# Patient Record
Sex: Male | Born: 1970 | Race: Black or African American | Hispanic: No | Marital: Married | Smoking: Former smoker
Health system: Southern US, Community
[De-identification: ages and names within clinical notes are randomized; demographics above are authoritative.]

## PROBLEM LIST (undated history)

## (undated) DIAGNOSIS — K219 Gastro-esophageal reflux disease without esophagitis: Secondary | ICD-10-CM

## (undated) DIAGNOSIS — Z720 Tobacco use: Secondary | ICD-10-CM

## (undated) DIAGNOSIS — I10 Essential (primary) hypertension: Secondary | ICD-10-CM

## (undated) DIAGNOSIS — J40 Bronchitis, not specified as acute or chronic: Secondary | ICD-10-CM

## (undated) DIAGNOSIS — E7800 Pure hypercholesterolemia, unspecified: Secondary | ICD-10-CM

## (undated) DIAGNOSIS — E669 Obesity, unspecified: Secondary | ICD-10-CM

## (undated) DIAGNOSIS — J948 Other specified pleural conditions: Principal | ICD-10-CM

## (undated) DIAGNOSIS — J189 Pneumonia, unspecified organism: Secondary | ICD-10-CM

## (undated) DIAGNOSIS — R7303 Prediabetes: Secondary | ICD-10-CM

## (undated) HISTORY — DX: Other specified pleural conditions: J94.8

## (undated) HISTORY — PX: APPENDECTOMY: SHX54

---

## 2003-03-26 DIAGNOSIS — J948 Other specified pleural conditions: Secondary | ICD-10-CM

## 2003-03-26 HISTORY — DX: Other specified pleural conditions: J94.8

## 2010-11-27 ENCOUNTER — Emergency Department (HOSPITAL_COMMUNITY)
Admission: EM | Admit: 2010-11-27 | Discharge: 2010-11-27 | Disposition: A | Discharge status: Home / Self Care | Payer: Self-pay | Attending: Emergency Medicine | Admitting: Emergency Medicine

## 2010-11-27 DIAGNOSIS — J45909 Unspecified asthma, uncomplicated: Secondary | ICD-10-CM | POA: Insufficient documentation

## 2010-11-27 DIAGNOSIS — F17200 Nicotine dependence, unspecified, uncomplicated: Secondary | ICD-10-CM | POA: Insufficient documentation

## 2010-11-27 DIAGNOSIS — I10 Essential (primary) hypertension: Secondary | ICD-10-CM | POA: Insufficient documentation

## 2010-11-27 DIAGNOSIS — R509 Fever, unspecified: Secondary | ICD-10-CM | POA: Insufficient documentation

## 2010-11-27 DIAGNOSIS — J189 Pneumonia, unspecified organism: Secondary | ICD-10-CM | POA: Insufficient documentation

## 2011-01-24 DIAGNOSIS — J189 Pneumonia, unspecified organism: Secondary | ICD-10-CM

## 2011-01-24 HISTORY — DX: Pneumonia, unspecified organism: J18.9

## 2011-02-07 ENCOUNTER — Emergency Department (HOSPITAL_COMMUNITY): Payer: Self-pay

## 2011-02-07 ENCOUNTER — Emergency Department (HOSPITAL_COMMUNITY)
Admission: EM | Admit: 2011-02-07 | Discharge: 2011-02-08 | Disposition: A | Discharge status: Home / Self Care | Payer: Self-pay | Attending: Emergency Medicine | Admitting: Emergency Medicine

## 2011-02-07 ENCOUNTER — Encounter: Payer: Self-pay | Admitting: Emergency Medicine

## 2011-02-07 DIAGNOSIS — R05 Cough: Secondary | ICD-10-CM | POA: Insufficient documentation

## 2011-02-07 DIAGNOSIS — J189 Pneumonia, unspecified organism: Secondary | ICD-10-CM | POA: Insufficient documentation

## 2011-02-07 DIAGNOSIS — R062 Wheezing: Secondary | ICD-10-CM | POA: Insufficient documentation

## 2011-02-07 DIAGNOSIS — E669 Obesity, unspecified: Secondary | ICD-10-CM | POA: Insufficient documentation

## 2011-02-07 DIAGNOSIS — R0602 Shortness of breath: Secondary | ICD-10-CM | POA: Insufficient documentation

## 2011-02-07 DIAGNOSIS — R5381 Other malaise: Secondary | ICD-10-CM | POA: Insufficient documentation

## 2011-02-07 DIAGNOSIS — E7800 Pure hypercholesterolemia, unspecified: Secondary | ICD-10-CM | POA: Insufficient documentation

## 2011-02-07 DIAGNOSIS — R509 Fever, unspecified: Secondary | ICD-10-CM | POA: Insufficient documentation

## 2011-02-07 DIAGNOSIS — IMO0001 Reserved for inherently not codable concepts without codable children: Secondary | ICD-10-CM | POA: Insufficient documentation

## 2011-02-07 DIAGNOSIS — I10 Essential (primary) hypertension: Secondary | ICD-10-CM | POA: Insufficient documentation

## 2011-02-07 DIAGNOSIS — R059 Cough, unspecified: Secondary | ICD-10-CM | POA: Insufficient documentation

## 2011-02-07 HISTORY — DX: Bronchitis, not specified as acute or chronic: J40

## 2011-02-07 HISTORY — DX: Obesity, unspecified: E66.9

## 2011-02-07 HISTORY — DX: Pure hypercholesterolemia, unspecified: E78.00

## 2011-02-07 HISTORY — DX: Pneumonia, unspecified organism: J18.9

## 2011-02-07 HISTORY — DX: Essential (primary) hypertension: I10

## 2011-02-07 LAB — CBC
MCH: 29.7 pg (ref 26.0–34.0)
MCV: 88.5 fL (ref 78.0–100.0)
Platelets: 164 10*3/uL (ref 150–400)
RDW: 14 % (ref 11.5–15.5)

## 2011-02-07 LAB — BASIC METABOLIC PANEL
Calcium: 8.7 mg/dL (ref 8.4–10.5)
GFR calc Af Amer: >90 mL/min (ref >90)
GFR calc non Af Amer: >90 mL/min (ref >90)
Sodium: 136 mEq/L (ref 135–145)

## 2011-02-07 LAB — DIFFERENTIAL
Basophils Absolute: 0 10*3/uL (ref 0.0–0.1)
Basophils Relative: 1 % (ref 0–1)
Eosinophils Absolute: 0.3 10*3/uL (ref 0.0–0.7)
Eosinophils Relative: 6 % — ABNORMAL HIGH (ref 0–5)
Neutrophils Relative %: 60 % (ref 43–77)

## 2011-02-07 MED ORDER — DIPHENHYDRAMINE HCL 25 MG PO CAPS
25.0000 mg | ORAL_CAPSULE | Freq: Once | ORAL | Status: AC
  Administered 2011-02-07: 25 mg via ORAL
  Filled 2011-02-07: qty 1

## 2011-02-07 MED ORDER — IPRATROPIUM BROMIDE 0.02 % IN SOLN
0.5000 mg | Freq: Once | RESPIRATORY_TRACT | Status: AC
  Administered 2011-02-07: 0.5 mg via RESPIRATORY_TRACT
  Filled 2011-02-07: qty 2.5

## 2011-02-07 MED ORDER — ALBUTEROL SULFATE (5 MG/ML) 0.5% IN NEBU
INHALATION_SOLUTION | RESPIRATORY_TRACT | Status: AC
  Filled 2011-02-07: qty 1.5

## 2011-02-07 MED ORDER — ALBUTEROL (5 MG/ML) CONTINUOUS INHALATION SOLN
5.0000 mg/h | INHALATION_SOLUTION | Freq: Once | RESPIRATORY_TRACT | Status: AC
  Administered 2011-02-07: 5 mg/h via RESPIRATORY_TRACT
  Filled 2011-02-07: qty 20

## 2011-02-07 MED ORDER — ACETAMINOPHEN 500 MG PO TABS
1000.0000 mg | ORAL_TABLET | ORAL | Status: AC
  Administered 2011-02-08: 1000 mg via ORAL
  Filled 2011-02-07: qty 2

## 2011-02-07 MED ORDER — AZITHROMYCIN 250 MG PO TABS
500.0000 mg | ORAL_TABLET | Freq: Once | ORAL | Status: AC
  Administered 2011-02-07: 500 mg via ORAL
  Filled 2011-02-07: qty 2

## 2011-02-07 MED ORDER — PREDNISONE 20 MG PO TABS
60.0000 mg | ORAL_TABLET | Freq: Once | ORAL | Status: AC
  Administered 2011-02-07: 60 mg via ORAL
  Filled 2011-02-07: qty 3

## 2011-02-07 NOTE — ED Provider Notes (Signed)
History     CSN: [id number] Arrival date & time: 02/07/2011  9:13 PM   First MD Initiated Contact with Patient 02/07/11 2246      Chief Complaint  Patient presents with  . Fever    (Consider location/radiation/quality/duration/timing/severity/associated sxs/prior treatment) Patient is a 40 y.o. male presenting with fever. The history is provided by the patient.  Fever Primary symptoms of the febrile illness include fever, fatigue, cough, wheezing, shortness of breath, myalgias and arthralgias. Primary symptoms do not include headaches, abdominal pain, nausea, vomiting, diarrhea, dysuria or rash. The current episode started yesterday. This is a new problem.  Pt states he has had body aches all day yesterday and today. Also reports wheezing and non productive cough. States today developed left facial swelling, however, no pain. Took ibuprofen last night and again this morning. States has been using his inhaler with no improvement.   Past Medical History  Diagnosis Date  . Hypertension   . Hypercholesterolemia   . Bronchitis   . Pneumonia   . Obesity     Past Surgical History  Procedure Date  . Appendectomy     No family history on file.  History  Substance Use Topics  . Smoking status: Current Everyday Smoker  . Smokeless tobacco: Not on file  . Alcohol Use: Yes      Review of Systems  Constitutional: Positive for fever, chills and fatigue.  HENT: Positive for congestion and facial swelling. Negative for ear pain, sore throat, mouth sores, neck pain, neck stiffness and dental problem.   Eyes: Negative.   Respiratory: Positive for cough, shortness of breath and wheezing.   Cardiovascular: Negative.   Gastrointestinal: Negative for nausea, vomiting, abdominal pain and diarrhea.  Genitourinary: Negative.  Negative for dysuria.  Musculoskeletal: Positive for myalgias and arthralgias.  Skin: Negative.  Negative for rash.  Neurological: Negative.  Negative for  headaches.  Psychiatric/Behavioral: Negative.     Allergies  Review of patient's allergies indicates no known allergies.  Home Medications   Current Outpatient Rx  Name Route Sig Dispense Refill  . AMLODIPINE BESYLATE 2.5 MG PO TABS Oral Take 2.5 mg by mouth daily.      . FUROSEMIDE 40 MG PO TABS Oral Take 40 mg by mouth daily.      Marland Kitchen LOVASTATIN 10 MG PO TABS Oral Take 10 mg by mouth at bedtime.        BP 140/85  Pulse 110  Temp(Src) 100.5 F (38.1 C) (Oral)  Resp 18  SpO2 98%  Physical Exam  Constitutional: He is oriented to person, place, and time. He appears well-developed and well-nourished.  HENT:  Head: Normocephalic and atraumatic.  Right Ear: Tympanic membrane, external ear and ear canal normal.  Left Ear: Tympanic membrane, external ear and ear canal normal.  Nose: Rhinorrhea present.  Mouth/Throat: Oropharynx is clear and moist and mucous membranes are normal. No oral lesions. Normal dentition. No dental abscesses, uvula swelling or dental caries.       Mild facial swelling over left lips and just lateral to the mouth. Non tender. No erythema. No induration.   Eyes: Conjunctivae are normal. Pupils are equal, round, and reactive to light.  Neck: Normal range of motion. Neck supple.  Cardiovascular: Normal rate and regular rhythm.   Pulmonary/Chest: Effort normal and breath sounds normal.       Wheezing in all lung fields bilaterally  Musculoskeletal: Normal range of motion. He exhibits no edema.  Neurological: He is alert and oriented to person,  place, and time. He has normal reflexes.  Skin: Skin is warm. He is diaphoretic.  Psychiatric: He has a normal mood and affect.    ED Course  Procedures (including critical care time)  Labs Reviewed  DIFFERENTIAL - Abnormal; Notable for the following:    Eosinophils Relative 6 (*)    All other components within normal limits  BASIC METABOLIC PANEL - Abnormal; Notable for the following:    Glucose, Bld 120 (*)     All other components within normal limits  CBC   Dg Chest 2 View  02/07/2011  *RADIOLOGY REPORT*  Clinical Data: Chest pain and shortness of breath.  CHEST - 2 VIEW  Comparison: None.  Findings: The lungs are well-aerated.  There is a round opacity noted at the superior aspect of the right lower lobe, likely reflecting a round pneumonia given the patient's symptoms.  There is no evidence of pleural effusion or pneumothorax.  The heart is normal in size; the mediastinal contour is within normal limits.  No acute osseous abnormalities are seen.  IMPRESSION: Round opacity noted at the superior aspect of the right lower lobe, likely reflecting round pneumonia given the patient's symptoms. However, a mass could have a similar appearance; would recommend treating for pneumonia, then follow-up chest radiograph to ensure resolution.  Original Report Authenticated By: Tonia Ghent, M.D.   Possible pneumonia on CXR. Will treat with nebs in ED, prednisone for wheezing and SOB. Will start on zithromax. Pt is maintaining his oxygen sat at 98%. Pt is febrile, HR 110. Ordered tylenol for fever.   Will follow up with pulmonology for repeat CXR/CT of the chest. Pt states he was told about this before, but never followed up in [place].      MDM          Lottie Mussel, PA 02/08/11 [phone]

## 2011-02-07 NOTE — ED Notes (Signed)
PT. REPORTS FEVER /CHILLS WITH GENERALIZED BODY ACHES AND PAIN WITH LEFT FACIAL SWELLING ONSET YESTERDAY , OCCASIONAL DRY COUGH.

## 2011-02-08 MED ORDER — AZITHROMYCIN 250 MG PO TABS: ORAL_TABLET | ORAL | Status: AC

## 2011-02-08 MED ORDER — PREDNISONE 20 MG PO TABS: 40.0000 mg | ORAL_TABLET | Freq: Every day | ORAL | Status: AC

## 2011-02-08 NOTE — ED Provider Notes (Signed)
Medical screening examination/treatment/procedure(s) were performed by non-physician practitioner and as supervising physician I was immediately available for consultation/collaboration.  Jasmine Awe, MD 02/08/11 [phone]

## 2011-07-23 ENCOUNTER — Encounter (HOSPITAL_COMMUNITY): Payer: Self-pay

## 2011-07-23 ENCOUNTER — Emergency Department (HOSPITAL_COMMUNITY): Payer: Medicaid Other

## 2011-07-23 ENCOUNTER — Inpatient Hospital Stay (HOSPITAL_COMMUNITY)
Admission: EM | Admit: 2011-07-23 | Discharge: 2011-07-24 | DRG: 313 | Disposition: A | Discharge status: Home / Self Care | Payer: Medicaid Other | Attending: Infectious Diseases | Admitting: Infectious Diseases

## 2011-07-23 DIAGNOSIS — E785 Hyperlipidemia, unspecified: Secondary | ICD-10-CM | POA: Diagnosis present

## 2011-07-23 DIAGNOSIS — F17200 Nicotine dependence, unspecified, uncomplicated: Secondary | ICD-10-CM | POA: Diagnosis present

## 2011-07-23 DIAGNOSIS — R222 Localized swelling, mass and lump, trunk: Secondary | ICD-10-CM

## 2011-07-23 DIAGNOSIS — R911 Solitary pulmonary nodule: Secondary | ICD-10-CM | POA: Diagnosis present

## 2011-07-23 DIAGNOSIS — J948 Other specified pleural conditions: Secondary | ICD-10-CM | POA: Diagnosis present

## 2011-07-23 DIAGNOSIS — R079 Chest pain, unspecified: Secondary | ICD-10-CM | POA: Diagnosis present

## 2011-07-23 DIAGNOSIS — K219 Gastro-esophageal reflux disease without esophagitis: Secondary | ICD-10-CM | POA: Diagnosis present

## 2011-07-23 DIAGNOSIS — E669 Obesity, unspecified: Secondary | ICD-10-CM | POA: Diagnosis present

## 2011-07-23 DIAGNOSIS — Z720 Tobacco use: Secondary | ICD-10-CM | POA: Diagnosis present

## 2011-07-23 DIAGNOSIS — I10 Essential (primary) hypertension: Secondary | ICD-10-CM | POA: Diagnosis present

## 2011-07-23 DIAGNOSIS — R0789 Other chest pain: Principal | ICD-10-CM | POA: Diagnosis present

## 2011-07-23 HISTORY — DX: Tobacco use: Z72.0

## 2011-07-23 HISTORY — DX: Gastro-esophageal reflux disease without esophagitis: K21.9

## 2011-07-23 HISTORY — DX: Prediabetes: R73.03

## 2011-07-23 LAB — D-DIMER, QUANTITATIVE: D-Dimer, Quant: 0.3 ug/mL-FEU (ref 0.00–0.48)

## 2011-07-23 LAB — BASIC METABOLIC PANEL
CO2: 24 mEq/L (ref 19–32)
Calcium: 9 mg/dL (ref 8.4–10.5)
Potassium: 4 mEq/L (ref 3.5–5.1)
Sodium: 138 mEq/L (ref 135–145)

## 2011-07-23 LAB — CARDIAC PANEL(CRET KIN+CKTOT+MB+TROPI)
CK, MB: 2.1 ng/mL (ref 0.3–4.0)
CK, MB: 1.8 ng/mL (ref 0.3–4.0)
Total CK: 165 U/L (ref 7–232)
Troponin I: <0.3 ng/mL (ref <0.30)
Troponin I: <0.3 ng/mL (ref <0.30)

## 2011-07-23 LAB — HEPATIC FUNCTION PANEL
ALT: 20 U/L (ref 0–53)
Albumin: 3 g/dL — ABNORMAL LOW (ref 3.5–5.2)
Alkaline Phosphatase: 64 U/L (ref 39–117)
Total Bilirubin: 0.3 mg/dL (ref 0.3–1.2)
Total Protein: 6.6 g/dL (ref 6.0–8.3)

## 2011-07-23 LAB — POCT I-STAT TROPONIN I: Troponin i, poc: 0 ng/mL (ref 0.00–0.08)

## 2011-07-23 LAB — CBC
MCH: 30 pg (ref 26.0–34.0)
Platelets: 188 10*3/uL (ref 150–400)
RBC: 4.77 MIL/uL (ref 4.22–5.81)
WBC: 6.5 10*3/uL (ref 4.0–10.5)

## 2011-07-23 MED ORDER — HYDROCODONE-ACETAMINOPHEN 5-325 MG PO TABS
1.0000 | ORAL_TABLET | ORAL | Status: DC | PRN
  Administered 2011-07-23: 2 via ORAL
  Filled 2011-07-23: qty 2

## 2011-07-23 MED ORDER — MORPHINE SULFATE 2 MG/ML IJ SOLN
2.0000 mg | INTRAMUSCULAR | Status: DC | PRN
  Administered 2011-07-23: 2 mg via INTRAVENOUS
  Filled 2011-07-23: qty 1

## 2011-07-23 MED ORDER — SODIUM CHLORIDE 0.9 % IJ SOLN
3.0000 mL | Freq: Two times a day (BID) | INTRAMUSCULAR | Status: DC
  Administered 2011-07-23 – 2011-07-24 (×3): 3 mL via INTRAVENOUS

## 2011-07-23 MED ORDER — SODIUM CHLORIDE 0.9 % IJ SOLN: 3.0000 mL | INTRAMUSCULAR | Status: DC | PRN

## 2011-07-23 MED ORDER — ENOXAPARIN SODIUM 40 MG/0.4ML ~~LOC~~ SOLN
40.0000 mg | SUBCUTANEOUS | Status: DC
  Administered 2011-07-23 – 2011-07-24 (×2): 40 mg via SUBCUTANEOUS
  Filled 2011-07-23 (×2): qty 0.4

## 2011-07-23 MED ORDER — ONDANSETRON HCL 4 MG PO TABS: 4.0000 mg | ORAL_TABLET | Freq: Four times a day (QID) | ORAL | Status: DC | PRN

## 2011-07-23 MED ORDER — ONDANSETRON HCL 4 MG/2ML IJ SOLN
4.0000 mg | Freq: Once | INTRAMUSCULAR | Status: AC
  Administered 2011-07-23: 4 mg via INTRAVENOUS
  Filled 2011-07-23: qty 2

## 2011-07-23 MED ORDER — PANTOPRAZOLE SODIUM 40 MG PO TBEC
40.0000 mg | DELAYED_RELEASE_TABLET | Freq: Every day | ORAL | Status: DC
  Administered 2011-07-23: 40 mg via ORAL
  Filled 2011-07-23 (×2): qty 1

## 2011-07-23 MED ORDER — NITROGLYCERIN 0.4 MG SL SUBL
0.4000 mg | SUBLINGUAL_TABLET | SUBLINGUAL | Status: DC | PRN
  Administered 2011-07-23: 0.4 mg via SUBLINGUAL
  Filled 2011-07-23: qty 75

## 2011-07-23 MED ORDER — IOHEXOL 350 MG/ML SOLN
80.0000 mL | Freq: Once | INTRAVENOUS | Status: AC | PRN
  Administered 2011-07-23: 80 mL via INTRAVENOUS

## 2011-07-23 MED ORDER — IBUPROFEN 800 MG PO TABS
800.0000 mg | ORAL_TABLET | Freq: Two times a day (BID) | ORAL | Status: DC
  Administered 2011-07-23 – 2011-07-24 (×3): 800 mg via ORAL
  Filled 2011-07-23 (×4): qty 1

## 2011-07-23 MED ORDER — DOCUSATE SODIUM 100 MG PO CAPS
100.0000 mg | ORAL_CAPSULE | Freq: Two times a day (BID) | ORAL | Status: DC
  Administered 2011-07-23 – 2011-07-24 (×3): 100 mg via ORAL
  Filled 2011-07-23 (×5): qty 1

## 2011-07-23 MED ORDER — ALBUTEROL SULFATE HFA 108 (90 BASE) MCG/ACT IN AERS
2.0000 | INHALATION_SPRAY | RESPIRATORY_TRACT | Status: DC | PRN
  Filled 2011-07-23: qty 6.7

## 2011-07-23 MED ORDER — GI COCKTAIL ~~LOC~~
30.0000 mL | Freq: Once | ORAL | Status: AC
  Administered 2011-07-23: 30 mL via ORAL
  Filled 2011-07-23: qty 30

## 2011-07-23 MED ORDER — SIMVASTATIN 5 MG PO TABS
5.0000 mg | ORAL_TABLET | Freq: Every day | ORAL | Status: DC
  Administered 2011-07-23: 5 mg via ORAL
  Filled 2011-07-23 (×2): qty 1

## 2011-07-23 MED ORDER — MORPHINE SULFATE 4 MG/ML IJ SOLN
4.0000 mg | Freq: Once | INTRAMUSCULAR | Status: AC
  Administered 2011-07-23: 4 mg via INTRAVENOUS
  Filled 2011-07-23: qty 1

## 2011-07-23 MED ORDER — SODIUM CHLORIDE 0.9 % IV SOLN
250.0000 mL | INTRAVENOUS | Status: DC | PRN
Start: 2011-07-23 — End: 2011-07-24

## 2011-07-23 MED ORDER — SODIUM CHLORIDE 0.9 % IJ SOLN
3.0000 mL | Freq: Two times a day (BID) | INTRAMUSCULAR | Status: DC
  Administered 2011-07-24: 3 mL via INTRAVENOUS

## 2011-07-23 MED ORDER — ONDANSETRON HCL 4 MG/2ML IJ SOLN: 4.0000 mg | Freq: Four times a day (QID) | INTRAMUSCULAR | Status: DC | PRN

## 2011-07-23 MED ORDER — ASPIRIN EC 81 MG PO TBEC
81.0000 mg | DELAYED_RELEASE_TABLET | Freq: Every day | ORAL | Status: DC
  Administered 2011-07-24: 81 mg via ORAL
  Filled 2011-07-23: qty 1

## 2011-07-23 MED ORDER — PNEUMOCOCCAL VAC POLYVALENT 25 MCG/0.5ML IJ INJ
0.5000 mL | INJECTION | INTRAMUSCULAR | Status: AC
  Filled 2011-07-23: qty 0.5

## 2011-07-23 NOTE — ED Notes (Signed)
Patient transported to CT 

## 2011-07-23 NOTE — Progress Notes (Signed)
  Echocardiogram 2D Echocardiogram has been performed.  Cathie Beams Deneen 07/23/2011, 2:50 PM

## 2011-07-23 NOTE — ED Provider Notes (Signed)
History     CSN: [id number]  Arrival date & time 07/23/11  5427   First MD Initiated Contact with Patient 07/23/11 [phone]      Chief Complaint  Patient presents with  . Chest Pain    (Consider location/radiation/quality/duration/timing/severity/associated sxs/prior treatment) HPI Comments: Patient does not take daily aspirin. 360m ASA given en route. NTG decreased pain from 10/10 to 7/10.   Patient is a 41y.o. male presenting with chest pain. The history is provided by the patient.  Chest Pain The chest pain began 1 - 2 hours ago (5:10am). Chest pain occurs constantly. The chest pain is improving. At its most intense, the pain is at 10/10. The pain is currently at 7/10. The quality of the pain is described as squeezing and sharp. The pain radiates to the left shoulder. Chest pain is worsened by certain positions (palpation). Pertinent negatives for primary symptoms include no fever, no syncope, no shortness of breath, no cough, no palpitations, no abdominal pain, no nausea, no vomiting and no dizziness.  Pertinent negatives for associated symptoms include no diaphoresis. He tried nitroglycerin, aspirin and NSAIDs for the symptoms. Risk factors include male gender, obesity and smoking/tobacco exposure.  His past medical history is significant for hyperlipidemia and hypertension.  Pertinent negatives for past medical history include no diabetes.  Pertinent negatives for family medical history include: no early MI in family.  Procedure history is negative for cardiac catheterization and stress echo.     Past Medical History  Diagnosis Date  . Hypertension   . Hypercholesterolemia   . Bronchitis   . Pneumonia   . Obesity     Past Surgical History  Procedure Date  . Appendectomy     No family history on file.  History  Substance Use Topics  . Smoking status: Current Everyday Smoker  . Smokeless tobacco: Not on file  . Alcohol Use: Yes      Review of Systems    Constitutional: Negative for fever and diaphoresis.  HENT: Negative for neck pain.   Eyes: Negative for redness.  Respiratory: Negative for cough and shortness of breath.   Cardiovascular: Positive for chest pain. Negative for palpitations, leg swelling and syncope.  Gastrointestinal: Negative for nausea, vomiting and abdominal pain.  Genitourinary: Negative for dysuria.  Musculoskeletal: Negative for back pain.  Skin: Negative for rash.  Neurological: Negative for dizziness, syncope and light-headedness.    Allergies  Review of patient's allergies indicates no known allergies.  Home Medications   Current Outpatient Rx  Name Route Sig Dispense Refill  . FUROSEMIDE 40 MG PO TABS Oral Take 40 mg by mouth daily.      .Marland KitchenLOVASTATIN 10 MG PO TABS Oral Take 10 mg by mouth at bedtime.        BP 135/70  Pulse 81  Temp(Src) 97.6 F (36.4 C) (Oral)  Resp 18  SpO2 100%  Physical Exam  Nursing note and vitals reviewed. Constitutional: He is oriented to person, place, and time. He appears well-developed and well-nourished.       obese  HENT:  Head: Normocephalic and atraumatic.  Mouth/Throat: Mucous membranes are normal. Mucous membranes are not dry.  Eyes: Conjunctivae are normal.  Neck: Trachea normal and normal range of motion. Neck supple. Normal carotid pulses and no JVD present. No muscular tenderness present. Carotid bruit is not present. No tracheal deviation present.  Cardiovascular: Normal rate, regular rhythm, S1 normal, S2 normal, normal heart sounds and intact distal pulses.  Exam reveals no  distant heart sounds and no decreased pulses.   No murmur heard. Pulmonary/Chest: Effort normal and breath sounds normal. No respiratory distress. He has no wheezes. He exhibits tenderness.       Mild L anterior chest wall tenderness  Abdominal: Soft. Normal aorta and bowel sounds are normal. There is no tenderness. There is no rebound and no guarding.  Musculoskeletal: He exhibits no  edema.  Neurological: He is alert and oriented to person, place, and time.  Skin: Skin is warm and dry. He is not diaphoretic. No cyanosis. No pallor.  Psychiatric: He has a normal mood and affect.    ED Course  Procedures (including critical care time)  Labs Reviewed  BASIC METABOLIC PANEL - Abnormal; Notable for the following:    Glucose, Bld 119 (*)    All other components within normal limits  CBC  D-DIMER, QUANTITATIVE  POCT I-STAT TROPONIN I   Dg Chest 2 View  07/23/2011  *RADIOLOGY REPORT*  Clinical Data: Left-sided chest pain. History of smoking.  CHEST - 2 VIEW  Comparison: Chest x-ray 02/07/2011.  Findings: There is a large mass-like opacity projecting in the region of the posterior aspect of the right lower lobe (likely within the superior segment).  This mass abuts the pleura and measures approximately 5.4 x 5.6 x 5.0 cm.  Lungs otherwise appear clear.  No pleural effusions.  Pulmonary vasculature and the cardiomediastinal silhouette are within normal limits.  IMPRESSION: 1.  Well-defined pleural-based mass like opacity projecting in the region of the superior segment of the right lower lobe.  This finding is similar compared to prior study from 02/07/2011, and is concerning for underlying malignancy.  Other differential considerations would include solitary fibrous tumor of the pleura. However, further evaluation with contrast enhanced chest CT is highly recommended at this time.  These results were called by telephone on 07/23/2011  at  07:10 a.m. to  Dr. Marnette Burgess, who verbally acknowledged these results.  Original Report Authenticated By: Etheleen Mayhew, M.D.   Ct Angio Chest W/cm &/or Wo Cm  07/23/2011  *RADIOLOGY REPORT*  Clinical Data: History of chest pain.  Evaluate for potential pulmonary embolism.  CT ANGIOGRAPHY CHEST  Technique:  Multidetector CT imaging of the chest using the standard protocol during bolus administration of intravenous contrast. Multiplanar  reconstructed images including MIPs were obtained and reviewed to evaluate the vascular anatomy.  Contrast: 63m OMNIPAQUE IOHEXOL 350 MG/ML SOLN  Comparison: Chest x-rays 07/23/2011 and 02/07/2011.  Findings:  Mediastinum: There are no filling defects within the pulmonary arterial tree to suggest underlying pulmonary embolism. Heart size is borderline enlarged. There is no significant pericardial fluid, thickening or pericardial calcification.  Numerous prominent borderline enlarged mediastinal and bilateral hilar lymph nodes are noted.  Esophagus is unremarkable in appearance.  Lungs/Pleura: In the posterior aspect of the right hemithorax there is a pleural-based mass which demonstrates heterogeneous internal attenuation/enhancement, measuring approximately 5.0 x 5.1 x 3.7 cm.  This lesion appears to arise from the pleura itself, and exerts mass effect upon the adjacent portions of the right lower lobe.  Within the superior segment of the right lower lobe, there is some peribronchovascular ground-glass attenuation air space disease, which likely represents some post obstructive pneumonitis. In the basal segments of the right lower lobe adjacent to this lesion there are areas of lucency surrounded by adjacent regions of ground-glass attenuation separated by geographic margins, most compatible with air trapping related to airway obstruction.  There are several tiny 2-4 mm nodules associated  with the major fissures bilaterally, favored to represent subpleural lymph nodes.  Other patchy areas of air trapping are also noted throughout the lungs bilaterally.  No other larger more suspicious appearing pulmonary nodules or masses are otherwise identified.  No pleural effusions.  Upper Abdomen: Unremarkable.  Musculoskeletal: There are no aggressive appearing lytic or blastic lesions noted in the visualized portions of the skeleton. Specifically, the posterior aspects of the right seventh and eighth ribs (which abut this  mass) appear normal in appearance.  IMPRESSION: 1.  No evidence of pulmonary embolus. 2.  5.0 x 5.1 x 3.7 cm pleural based mass.  This lesion is relatively well defined, and based on comparison with prior chest radiographs is similar in size to prior study from 02/07/2011. Differential considerations include both benign lesions such as solitary fibrous tumor of the pleura (some of which have malignant behavior) or adenomatoid tumor of the pleura, and malignant lesions (desmoplastic round cell tumor, synovial sarcoma, etc). Clinical correlation and consideration for biopsy of this lesion is recommended. 3.  Because of mass effect in the adjacent portions of the right lower lobe, there is combination of air trapping and adjacent post obstructive pneumonitis in the right lower lobe, as above. 4.  There are additional scattered areas of air trapping throughout the lungs bilaterally suggesting underlying small airways disease. 5.  In addition, there is some nodularity of the fissures and multiple borderline enlarged and mildly enlarged mediastinal and bilateral hilar lymph nodes.  This appearance is nonspecific, but could suggest an underlying systemic disease such as sarcoidosis. Clinical correlation is recommended.  Original Report Authenticated By: Etheleen Mayhew, M.D.     1. Chest pain     6:32 AM Patient seen and examined. Work-up initiated. Medications ordered. Discussed with Dr. Marnette Burgess.   Vital signs reviewed and are as follows: Filed Vitals:   07/23/11 0619  BP: 135/70  Pulse: 81  Temp: 97.6 F (36.4 C)  Resp: 18    Date: 07/23/2011  Rate: 82  Rhythm: normal sinus rhythm  QRS Axis: normal  Intervals: normal  ST/T Wave abnormalities: nonspecific T wave changes  Conduction Disutrbances:none  Narrative Interpretation: T-wave inversions inferolaterally  Old EKG Reviewed: none available  Patient was seen by Dr. Marnette Burgess. Will perform CT angio to r/o PE, dissection. Will admit to medicine  given risk factors and EKG changes. No PCP.   9:29 AM R pleural nodule known. Patient states he had it biopsied in 2000 and it was found to be benign.   Patient is still having CP. He agrees to have some morphine (refused pain medicine earlier). This was ordered. Will call for admission.    9:41 AM Teaching service admission.   MDM  Admit for CP (EKG changes, risk factors, continuing chest pain). Nodule is benign per patient.         [place], [place] 07/23/11 [phone]

## 2011-07-23 NOTE — ED Notes (Signed)
EKG given to Dr. Dierdre Highman. No OLD ekg.

## 2011-07-23 NOTE — H&P (Signed)
Hospital Admission Note Date: 07/23/2011  Patient name:  Douglas Camacho  Medical record number:  [id number] Date of birth:  November 28, 1970  Age: 41 y.o. Gender: male PCP:    No primary provider on file.  Medical Service:   Internal Medicine Teaching Service   Attending physician:  Dr. Eppie Gibson First Contact:   Dr. Michail Sermon    Pager: [phone]  Second Contact:   Dr. Posey Pronto   Pager: [phone] After Hours:    First Contact   Pager: [phone]      Second Contact  Pager: [phone]   Chief Complaint: Chest pain  History of Present Illness: Patient is a 41 y.o. male with a PMHx of HTN, HLD, Tobacco abuse, and lung nodule who presents to [facility] for evaluation of left sided chest pain. Patient reports the chest pain awoke him from sleep at 5:15 am. He describes the chest pain as squeezing, and radiated to left shoulder. No nausea, vomiting, sweating, or shortness of breath accompanying the chest pain. Patient received 3 Ibuprofen from his wife. After an hour the chest pain persisted which prompted patient to come to the ER. Patient was given 4 baby asa in the ambulance, along with 2 NTG. Patient continued to have chest pain. It is worse when sitting up or moving side to side. No prior episodes of chest pain in the past. No cough, or history of shortness of breath on exertion. Patient denies recent trauma, or vigorous physical activity. He states he works at [employer] He says the most he will ever lift at work is 10 lbs. Pt was also given morphine in ER which made the pain dull and less squeezing, though it is still very positional. He also has a history of GERD but states this pain feels different than acid reflux. He is on anti-hypertensives and cholesterol medication which he has not taken in 5 months since moving to [place] because he has not established a new PCP. He denies cough, fever, chills, changes in bowel and urinary habits.   Current Outpatient Medications:  Current Outpatient  Prescriptions  Medication Sig Dispense Refill  . furosemide (LASIX) 40 MG tablet Take 40 mg by mouth daily.         norvasc      lisinopril      HCTZ (but was stopped due to side effects)     . lovastatin (MEVACOR) 10 MG tablet Take 10 mg by mouth at bedtime.          Allergies: No Known Allergies   Past Medical History: Past Medical History  Diagnosis Date  . Hypertension   . Hypercholesterolemia   . Bronchitis   . Pneumonia Nov. 2012  . Obesity   . Lung nodule 2000    right sided post. pleural nodule s/p biopsy 2000 where patient was told it was benign  . Pre-diabetes   . Tobacco abuse   . GERD (gastroesophageal reflux disease)     Past Surgical History: Past Surgical History  Procedure Date  . Appendectomy     Family History: Family History  Problem Relation Age of Onset  . Kidney failure Mother     ESRD on HD for last 23 years  . Hypertension Father   . Diabetes Father     Social History: History   Social History  . Marital Status: Married    Spouse Name: N/A    Number of Children: 4  . Years of Education: 12   Occupational History  .  machine operator     Social History Main Topics  . Smoking status: Current Everyday Smoker -- 0.3 packs/day for 11 years    Types: Cigarettes  . Smokeless tobacco: Never Used  . Alcohol Use: Yes     drinks 4-5 cans of beer daily  . Drug Use: No  . Sexually Active: Yes -- Male partner(s)   Other Topics Concern  . Not on file   Social History Narrative   Recently moved from [place], was living near [place] 4-5 months agoWorks at [employer] a machine that cuts [employer] glassCompleted high school4 childrenLives with [employer]    Review of Systems: Constitutional:  Denies fever, chills, diaphoresis, appetite change and fatigue.  HEENT: Denies congestion, sore throat, rhinorrhea, sneezing, mouth sores, trouble swallowing, neck pain  Respiratory: Denies SOB, DOE, cough, and wheezing.    Cardiovascular: Denies palpitations and leg swelling.  Gastrointestinal: Denies nausea, vomiting, abdominal pain, diarrhea, constipation, blood in stool and abdominal distention.  Genitourinary: Denies dysuria, urgency, frequency, hematuria, flank pain and difficulty urinating.  Musculoskeletal: Denies myalgias, back pain, joint swelling, arthralgias and gait problem.   Skin: Denies pallor, rash and wound.  Neurological: Denies dizziness, seizures, syncope, weakness, light-headedness, numbness and headaches.     Vital Signs: Filed Vitals:   07/23/11 0830  BP: 122/64  Pulse: 69  Temp:   Resp: 18     Physical Exam: General: Vital signs reviewed and noted. Well-developed, well-nourished, in no acute distress; alert, appropriate and cooperative throughout examination.  Head: Normocephalic, atraumatic.  Eyes: PERRL, EOMI, No signs of anemia or jaundince.  Nose: Mucous membranes moist, not inflammed, nonerythematous.  Throat: Oropharynx nonerythematous, no exudate appreciated.   Neck: No deformities, masses, or tenderness noted.Supple, No carotid Bruits, no JVD.  Lungs:  Normal respiratory effort. Clear to auscultation BL with faint expiratory wheezes heart right lung base, no crackles  Heart: RRR. S1 and S2 normal without gallop, murmur, or rubs. Tender to palpation left upper breast/chest region  Abdomen:  BS normoactive. Soft, Nondistended, non-tender.  No masses or organomegaly.  Extremities: No pretibial edema.  Neurologic: A&O X3, CN II - XII are grossly intact. Motor strength is 5/5 in the all 4 extremities  Skin: No visible rashes, scars.   Lab results: Basic Metabolic Panel: Recent Labs  Basename 07/23/11 0630   NA 138   K 4.0   CL 104   CO2 24   GLUCOSE 119*   BUN 14   CREATININE 0.86   CALCIUM 9.0   MG --   PHOS --   CBC: Recent Labs  Basename 07/23/11 0630   WBC 6.5   NEUTROABS --   HGB 14.3   HCT 41.9   MCV 87.8   PLT 188   Cardiac Enzymes: POC Trop  negative  D-Dimer: Recent Labs  [facility] 07/23/11 0625   DDIMER 0.30   Imaging results:  Dg Chest 2 View  07/23/2011  *RADIOLOGY REPORT*  Clinical Data: Left-sided chest pain. History of smoking.  CHEST - 2 VIEW  Comparison: Chest x-ray 02/07/2011.  Findings: There is a large mass-like opacity projecting in the region of the posterior aspect of the right lower lobe (likely within the superior segment).  This mass abuts the pleura and measures approximately 5.4 x 5.6 x 5.0 cm.  Lungs otherwise appear clear.  No pleural effusions.  Pulmonary vasculature and the cardiomediastinal silhouette are within normal limits.  IMPRESSION: 1.  Well-defined pleural-based mass like opacity projecting in the region of  the superior segment of the right lower lobe.  This finding is similar compared to prior study from 02/07/2011, and is concerning for underlying malignancy.  Other differential considerations would include solitary fibrous tumor of the pleura. However, further evaluation with contrast enhanced chest CT is highly recommended at this time.  These results were called by telephone on 07/23/2011  at  07:10 a.m. to  Dr. Marnette Burgess, who verbally acknowledged these results.  Original Report Authenticated By: Etheleen Mayhew, M.D.   Ct Angio Chest W/cm &/or Wo Cm  07/23/2011  *RADIOLOGY REPORT*  Clinical Data: History of chest pain.  Evaluate for potential pulmonary embolism.  CT ANGIOGRAPHY CHEST  Technique:  Multidetector CT imaging of the chest using the standard protocol during bolus administration of intravenous contrast. Multiplanar reconstructed images including MIPs were obtained and reviewed to evaluate the vascular anatomy.  Contrast: 75m OMNIPAQUE IOHEXOL 350 MG/ML SOLN  Comparison: Chest x-rays 07/23/2011 and 02/07/2011.  Findings:  Mediastinum: There are no filling defects within the pulmonary arterial tree to suggest underlying pulmonary embolism. Heart size is borderline enlarged. There is no  significant pericardial fluid, thickening or pericardial calcification.  Numerous prominent borderline enlarged mediastinal and bilateral hilar lymph nodes are noted.  Esophagus is unremarkable in appearance.  Lungs/Pleura: In the posterior aspect of the right hemithorax there is a pleural-based mass which demonstrates heterogeneous internal attenuation/enhancement, measuring approximately 5.0 x 5.1 x 3.7 cm.  This lesion appears to arise from the pleura itself, and exerts mass effect upon the adjacent portions of the right lower lobe.  Within the superior segment of the right lower lobe, there is some peribronchovascular ground-glass attenuation air space disease, which likely represents some post obstructive pneumonitis. In the basal segments of the right lower lobe adjacent to this lesion there are areas of lucency surrounded by adjacent regions of ground-glass attenuation separated by geographic margins, most compatible with air trapping related to airway obstruction.  There are several tiny 2-4 mm nodules associated with the major fissures bilaterally, favored to represent subpleural lymph nodes.  Other patchy areas of air trapping are also noted throughout the lungs bilaterally.  No other larger more suspicious appearing pulmonary nodules or masses are otherwise identified.  No pleural effusions.  Upper Abdomen: Unremarkable.  Musculoskeletal: There are no aggressive appearing lytic or blastic lesions noted in the visualized portions of the skeleton. Specifically, the posterior aspects of the right seventh and eighth ribs (which abut this mass) appear normal in appearance.  IMPRESSION: 1.  No evidence of pulmonary embolus. 2.  5.0 x 5.1 x 3.7 cm pleural based mass.  This lesion is relatively well defined, and based on comparison with prior chest radiographs is similar in size to prior study from 02/07/2011. Differential considerations include both benign lesions such as solitary fibrous tumor of the pleura  (some of which have malignant behavior) or adenomatoid tumor of the pleura, and malignant lesions (desmoplastic round cell tumor, synovial sarcoma, etc). Clinical correlation and consideration for biopsy of this lesion is recommended. 3.  Because of mass effect in the adjacent portions of the right lower lobe, there is combination of air trapping and adjacent post obstructive pneumonitis in the right lower lobe, as above. 4.  There are additional scattered areas of air trapping throughout the lungs bilaterally suggesting underlying small airways disease. 5.  In addition, there is some nodularity of the fissures and multiple borderline enlarged and mildly enlarged mediastinal and bilateral hilar lymph nodes.  This appearance is nonspecific, but could  suggest an underlying systemic disease such as sarcoidosis. Clinical correlation is recommended.  Original Report Authenticated By: Etheleen Mayhew, M.D.     Other results: EKG: there are no previous tracings available for comparison, nonspecific ST and T waves changes, inverted twaves.    Assessment & Plan: This is a 41 year old man with HTN, HLD, and current smoker who is admitted for chest pain.   1.) Chest pain - exact etiology unknown, however differential includes MSK as it is worse with sitting up, and tender to palpation on left side vs GERD vs ACS vs Pneumonitis as patient with history of prominent long nodule with post-obstructive changes. Cannot rule out ACS as patient with risk factors. Other things such as PE and dissection ruled out with negative imaging studies.  Plan: -Admit to tele -NTG prn -O2 prn -Morphine 2 mg Q3 h prn -Ibuprofen for MSK/anti-inflammatory -Albuterol prn -Cycle cardiac enzymes and repeat EKG in am -Risk stratify: check A1C and Lipid profile  -Smoking cessation counseling provided to patient  -2D echo  2.) Lung mass - right pleural based 5 cm mass with mass effect seen on CXR and CT angio. According to  patient this mass was biopsied in 2000 and was considered benign. Subpleural lymph nodes also seen, mass effect, and scattered areas of air trapping are areas of concern, suggesting underlying airway disease or sarcoidosis. Don't believe the chest pain he is having now correlates to lung mass. Patient needs outpatient Pulmonologist follow-up.  3.) HTN - Currently stable as patient has been given NTG in ER. Has not taken medications in over 4 months. Will restart patient's ACE-I and Norvasc in am.   4.) HLD - Off statin for 4-5 months now as was not able to receive refill. Will check Lipid panel and start statin.    5.) Pre-diabetes - elevated blood sugars, increased waist circumference obesity making diabetes likely in this patient. Will monitor fasting CBG and check A1C.    DVT PPX - Lovenox        Jolene Provost, M.D. (PGY3)          I have seen and examined the patient. I reviewed the resident/fellow note and agree with the findings and plan of care as documented. My additions and revisions are included.   Signature:  ____________________________________________     Internal Medicine Teaching Service Attending    Date:    ____________________________________________

## 2011-07-23 NOTE — ED Provider Notes (Signed)
Medical screening examination/treatment/procedure(s) were conducted as a shared visit with non-physician practitioner(s) and myself.  I personally evaluated the patient during the encounter. PT evaluated for L sided CP. Heart RRR, lungs CTA, given abnormal CXR with symptoms, CTA ordered and MED admit for CP non compliant with HTN and HLD  Sunnie Nielsen, MD 07/23/11 2307

## 2011-07-23 NOTE — ED Notes (Addendum)
Patient from home. Sudden onset of substernal cp radiating to back while cooking breakfast. Rated 10/10, described as a dull ache. No sob, diaphoresis, n/v, headache, dizziness or abd pain. Received ASA 324 mg and Nitro 0.4 mg SL x 2. Pain decreased to 7/10 after medication administration. PIV 18g RAC. ECG NSR. VSS. Hx HTN and DM. Noncompliant with meds.

## 2011-07-24 LAB — BASIC METABOLIC PANEL
BUN: 14 mg/dL (ref 6–23)
CO2: 25 mEq/L (ref 19–32)
Chloride: 104 mEq/L (ref 96–112)
Glucose, Bld: 102 mg/dL — ABNORMAL HIGH (ref 70–99)
Potassium: 4.2 mEq/L (ref 3.5–5.1)
Sodium: 137 mEq/L (ref 135–145)

## 2011-07-24 LAB — CBC
HCT: 42.1 % (ref 39.0–52.0)
Hemoglobin: 13.9 g/dL (ref 13.0–17.0)
MCH: 29.6 pg (ref 26.0–34.0)
MCV: 89.8 fL (ref 78.0–100.0)
RBC: 4.69 MIL/uL (ref 4.22–5.81)

## 2011-07-24 LAB — LIPID PANEL
Cholesterol: 144 mg/dL (ref 0–200)
LDL Cholesterol: 73 mg/dL (ref 0–99)
Total CHOL/HDL Ratio: 3.1 RATIO
VLDL: 25 mg/dL (ref 0–40)

## 2011-07-24 LAB — GLUCOSE, CAPILLARY: Glucose-Capillary: 107 mg/dL — ABNORMAL HIGH (ref 70–99)

## 2011-07-24 MED ORDER — PANTOPRAZOLE SODIUM 40 MG PO TBEC
40.0000 mg | DELAYED_RELEASE_TABLET | Freq: Every day | ORAL | Status: DC
  Administered 2011-07-24 (×2): 40 mg via ORAL
  Filled 2011-07-24: qty 1

## 2011-07-24 MED ORDER — ALUMINUM & MAGNESIUM HYDROXIDE 200-200 MG/5ML PO SUSP: 10.0000 mL | Freq: Four times a day (QID) | ORAL | Status: AC | PRN

## 2011-07-24 MED ORDER — PNEUMOCOCCAL VAC POLYVALENT 25 MCG/0.5ML IJ INJ
0.5000 mL | INJECTION | Freq: Once | INTRAMUSCULAR | Status: AC
  Administered 2011-07-24: 0.5 mL via INTRAMUSCULAR
  Filled 2011-07-24: qty 0.5

## 2011-07-24 MED ORDER — ATENOLOL 25 MG PO TABS: 25.0000 mg | ORAL_TABLET | Freq: Every day | ORAL | Status: DC

## 2011-07-24 NOTE — Progress Notes (Signed)
Internal Medicine Teaching Service Attending Note Date: 07/24/2011  Patient name: Douglas Camacho  Medical record number: [id number]  Date of birth: 04-26-1970   I have seen and evaluated Maryellen Pile and discussed their care with the Residency Team.  Mr. Cupples presents with acute onset and severe left side stabbing chest pain with radiation to his back. Although only 35, he is PPD smoker and has hypertension and thus at risk for CAD/MI and aortic dissection. Serial troponins, EKGs, and CT chest angio have excluded cardiac even, dissection or pulmonary embolus. His pleural based mass has been present for nealy a decade by his history and is unchanged on CT over the past year. He will need cardiac perfusion study but doubt coronary disease. I agree with plans and management and discharge later today with appropriate followup over next two weeks.  Physical Exam: Blood pressure 123/83, pulse 66, temperature 97.5 F (36.4 C), temperature source Oral, resp. rate 20, SpO2 97.00%. COR:No murmurs, rubs or gallops. Chest Clear and no supraclavicular adenopthy.  Lab results: Results for orders placed during the hospital encounter of 07/23/11 (from the past 24 hour(s))  CARDIAC PANEL(CRET KIN+CKTOT+MB+TROPI)     Status: Normal   Collection Time   07/23/11  8:35 PM      Component Value Range   Total CK 165  7 - 232 (U/L)   CK, MB 1.8  0.3 - 4.0 (ng/mL)   Troponin I <0.30  <0.30 (ng/mL)   Relative Index 1.1  0.0 - 2.5   CARDIAC PANEL(CRET KIN+CKTOT+MB+TROPI)     Status: Normal   Collection Time   07/24/11  5:12 AM      Component Value Range   Total CK 137  7 - 232 (U/L)   CK, MB 2.2  0.3 - 4.0 (ng/mL)   Troponin I <0.30  <0.30 (ng/mL)   Relative Index 1.6  0.0 - 2.5   BASIC METABOLIC PANEL     Status: Abnormal   Collection Time   07/24/11  5:12 AM      Component Value Range   Sodium 137  135 - 145 (mEq/L)   Potassium 4.2  3.5 - 5.1 (mEq/L)   Chloride 104  96 - 112 (mEq/L)   CO2 25  19 - 32 (mEq/L)     Glucose, Bld 102 (*) 70 - 99 (mg/dL)   BUN 14  6 - 23 (mg/dL)   Creatinine, Ser 1.05  0.50 - 1.35 (mg/dL)   Calcium 8.6  8.4 - 10.5 (mg/dL)   GFR calc non Af Amer 87 (*) >90 (mL/min)   GFR calc Af Amer >90  >90 (mL/min)  CBC     Status: Normal   Collection Time   07/24/11  5:12 AM      Component Value Range   WBC 5.7  4.0 - 10.5 (K/uL)   RBC 4.69  4.22 - 5.81 (MIL/uL)   Hemoglobin 13.9  13.0 - 17.0 (g/dL)   HCT 42.1  39.0 - 52.0 (%)   MCV 89.8  78.0 - 100.0 (fL)   MCH 29.6  26.0 - 34.0 (pg)   MCHC 33.0  30.0 - 36.0 (g/dL)   RDW 13.1  11.5 - 15.5 (%)   Platelets 182  150 - 400 (K/uL)  LIPID PANEL     Status: Normal   Collection Time   07/24/11  5:12 AM      Component Value Range   Cholesterol 144  0 - 200 (mg/dL)   Triglycerides 123  <150 (mg/dL)  HDL 46  >39 (mg/dL)   Total CHOL/HDL Ratio 3.1     VLDL 25  0 - 40 (mg/dL)   LDL Cholesterol 73  0 - 99 (mg/dL)  GLUCOSE, CAPILLARY     Status: Abnormal   Collection Time   07/24/11  9:17 AM      Component Value Range   Glucose-Capillary 107 (*) 70 - 99 (mg/dL)    Imaging results:  Dg Chest 2 View  07/23/2011  *RADIOLOGY REPORT*  Clinical Data: Left-sided chest pain. History of smoking.  CHEST - 2 VIEW  Comparison: Chest x-ray 02/07/2011.  Findings: There is a large mass-like opacity projecting in the region of the posterior aspect of the right lower lobe (likely within the superior segment).  This mass abuts the pleura and measures approximately 5.4 x 5.6 x 5.0 cm.  Lungs otherwise appear clear.  No pleural effusions.  Pulmonary vasculature and the cardiomediastinal silhouette are within normal limits.  IMPRESSION: 1.  Well-defined pleural-based mass like opacity projecting in the region of the superior segment of the right lower lobe.  This finding is similar compared to prior study from 02/07/2011, and is concerning for underlying malignancy.  Other differential considerations would include solitary fibrous tumor of the pleura.  However, further evaluation with contrast enhanced chest CT is highly recommended at this time.  These results were called by telephone on 07/23/2011  at  07:10 a.m. to  Dr. Marnette Burgess, who verbally acknowledged these results.  Original Report Authenticated By: Etheleen Mayhew, M.D.   Ct Angio Chest W/cm &/or Wo Cm  07/23/2011  *RADIOLOGY REPORT*  Clinical Data: History of chest pain.  Evaluate for potential pulmonary embolism.  CT ANGIOGRAPHY CHEST  Technique:  Multidetector CT imaging of the chest using the standard protocol during bolus administration of intravenous contrast. Multiplanar reconstructed images including MIPs were obtained and reviewed to evaluate the vascular anatomy.  Contrast: 47m OMNIPAQUE IOHEXOL 350 MG/ML SOLN  Comparison: Chest x-rays 07/23/2011 and 02/07/2011.  Findings:  Mediastinum: There are no filling defects within the pulmonary arterial tree to suggest underlying pulmonary embolism. Heart size is borderline enlarged. There is no significant pericardial fluid, thickening or pericardial calcification.  Numerous prominent borderline enlarged mediastinal and bilateral hilar lymph nodes are noted.  Esophagus is unremarkable in appearance.  Lungs/Pleura: In the posterior aspect of the right hemithorax there is a pleural-based mass which demonstrates heterogeneous internal attenuation/enhancement, measuring approximately 5.0 x 5.1 x 3.7 cm.  This lesion appears to arise from the pleura itself, and exerts mass effect upon the adjacent portions of the right lower lobe.  Within the superior segment of the right lower lobe, there is some peribronchovascular ground-glass attenuation air space disease, which likely represents some post obstructive pneumonitis. In the basal segments of the right lower lobe adjacent to this lesion there are areas of lucency surrounded by adjacent regions of ground-glass attenuation separated by geographic margins, most compatible with air trapping related to airway  obstruction.  There are several tiny 2-4 mm nodules associated with the major fissures bilaterally, favored to represent subpleural lymph nodes.  Other patchy areas of air trapping are also noted throughout the lungs bilaterally.  No other larger more suspicious appearing pulmonary nodules or masses are otherwise identified.  No pleural effusions.  Upper Abdomen: Unremarkable.  Musculoskeletal: There are no aggressive appearing lytic or blastic lesions noted in the visualized portions of the skeleton. Specifically, the posterior aspects of the right seventh and eighth ribs (which abut this mass) appear  normal in appearance.  IMPRESSION: 1.  No evidence of pulmonary embolus. 2.  5.0 x 5.1 x 3.7 cm pleural based mass.  This lesion is relatively well defined, and based on comparison with prior chest radiographs is similar in size to prior study from 02/07/2011. Differential considerations include both benign lesions such as solitary fibrous tumor of the pleura (some of which have malignant behavior) or adenomatoid tumor of the pleura, and malignant lesions (desmoplastic round cell tumor, synovial sarcoma, etc). Clinical correlation and consideration for biopsy of this lesion is recommended. 3.  Because of mass effect in the adjacent portions of the right lower lobe, there is combination of air trapping and adjacent post obstructive pneumonitis in the right lower lobe, as above. 4.  There are additional scattered areas of air trapping throughout the lungs bilaterally suggesting underlying small airways disease. 5.  In addition, there is some nodularity of the fissures and multiple borderline enlarged and mildly enlarged mediastinal and bilateral hilar lymph nodes.  This appearance is nonspecific, but could suggest an underlying systemic disease such as sarcoidosis. Clinical correlation is recommended.  Original Report Authenticated By: Etheleen Mayhew, M.D.    Assessment and Plan: I agree with the formulated  Assessment and Plan with the following changes: See my above notes for assessment and plan. Lars Mage

## 2011-07-24 NOTE — Consult Note (Signed)
Pt smokes 1/3 ppd and says he can't quit on his own and needs help with medical aids. Recommended 2 mg nicotine gum for pt to use PRN. Discussed gum use directions. Pt verbalizes understanding and is in action stage. Will try the gum. Referred to 1-800 quit now for f/u and support. Discussed oral fixation substitutes, second hand smoke and in home smoking policy. Reviewed and gave pt Written education/contact information.

## 2011-07-24 NOTE — Discharge Instructions (Signed)
1. Follow up with the [facility] clinic in 2 weeks 2. Please ask your wife to find out the hospital name in [place] where you had your Lung mass Biopsy and bring it to your office visit. We will send the request for medical records 3. We will also set up Pulmonology referral for you once we receives the medical records. 4. Please stop your lasix. We will start you on Atenolol.

## 2011-07-24 NOTE — Progress Notes (Signed)
Discharge review done with patient.   Patient acknowledged understanding of information provided.  Prescriptions given per MD's order.  Patient is stable and discharged home with wife. Ferdinand Revoir Senze  

## 2011-07-25 MED FILL — Perflutren Lipid Microsphere IV Susp 6.52 MG/ML: INTRAVENOUS | Qty: 2 | Status: AC

## 2011-07-25 NOTE — Discharge Summary (Signed)
[facility] Discharge Note  Name: Douglas Camacho MRN: [id number] DOB: 1970-06-02 41 y.o.  Date of Admission: 07/23/2011  6:12 AM Date of Discharge: 07/24/2011 Attending Physician: Dr. Lars Mage  Discharge Diagnosis:  1. Atypical Chest pain  2. HTN (hypertension)  3. HLD (hyperlipidemia)  4. Tobacco abuse  5. Right pleural mass   Discharge Medications: Medication List  As of 07/25/2011  1:09 PM   STOP taking these medications         furosemide 40 MG tablet         TAKE these medications         aluminum-magnesium hydroxide 200-200 MG/5ML suspension   Take 10 mLs by mouth every 6 (six) hours as needed for indigestion.      atenolol 25 MG tablet   Commonly known as: TENORMIN   Take 1 tablet (25 mg total) by mouth daily.      lovastatin 10 MG tablet   Commonly known as: MEVACOR   Take 10 mg by mouth at bedtime.            Disposition and follow-up:   Mr.Carnie Rumberger was discharged from [facility] in Good condition.   He will need to follow up with [facility] clinic for routine hospital follow up. 1.He is instructed to bring the hospital information where he had right pleural biopsy done years ago. We will need to obtain medical records of his biopsy pathology report.  Once we receive the medical records, we will need to set up the referral for his pleural mass to be monitored by Pulmonologist.   2. Please follow up on his BP. If still elevated, may consider to add ACEI  3. Tobacco cessation done, please continue to follow up.   Follow-up Appointments: Follow-up Information    Follow up with [facility], MD on 08/09/2011. (at 245 pm)    Contact information:   [place] [phone]         Discharge Orders    Future Appointments: Provider: Department: Dept Phone: Center:   08/09/2011 2:45 PM Jolene Provost, MD [facility] [phone] [facility]      Consultations:   None  Procedures Performed:  Dg Chest 2 View  07/23/2011  *RADIOLOGY REPORT*  Clinical Data: Left-sided chest pain. History of smoking.  CHEST - 2 VIEW  Comparison: Chest x-ray 02/07/2011.  Findings: There is a large mass-like opacity projecting in the region of the posterior aspect of the right lower lobe (likely within the superior segment).  This mass abuts the pleura and measures approximately 5.4 x 5.6 x 5.0 cm.  Lungs otherwise appear clear.  No pleural effusions.  Pulmonary vasculature and the cardiomediastinal silhouette are within normal limits.  IMPRESSION: 1.  Well-defined pleural-based mass like opacity projecting in the region of the superior segment of the right lower lobe.  This finding is similar compared to prior study from 02/07/2011, and is concerning for underlying malignancy.  Other differential considerations would include solitary fibrous tumor of the pleura. However, further evaluation with contrast enhanced chest CT is highly recommended at this time.  These results were called by telephone on 07/23/2011  at  07:10 a.m. to  Dr. Marnette Burgess, who verbally acknowledged these results.  Original Report Authenticated By: Etheleen Mayhew, M.D.   Ct Angio Chest W/cm &/or Wo Cm  07/23/2011  *RADIOLOGY REPORT*  Clinical Data: History of chest pain.  Evaluate for potential pulmonary embolism.  CT ANGIOGRAPHY CHEST  Technique:  Multidetector CT imaging of the chest using the standard protocol during bolus administration of intravenous contrast. Multiplanar reconstructed images including MIPs were obtained and reviewed to evaluate the vascular anatomy.  Contrast: 55m OMNIPAQUE IOHEXOL 350 MG/ML SOLN  Comparison: Chest x-rays 07/23/2011 and 02/07/2011.  Findings:  Mediastinum: There are no filling defects within the pulmonary arterial tree to suggest underlying pulmonary embolism. Heart size is borderline enlarged. There is no significant pericardial fluid, thickening or pericardial calcification.   Numerous prominent borderline enlarged mediastinal and bilateral hilar lymph nodes are noted.  Esophagus is unremarkable in appearance.  Lungs/Pleura: In the posterior aspect of the right hemithorax there is a pleural-based mass which demonstrates heterogeneous internal attenuation/enhancement, measuring approximately 5.0 x 5.1 x 3.7 cm.  This lesion appears to arise from the pleura itself, and exerts mass effect upon the adjacent portions of the right lower lobe.  Within the superior segment of the right lower lobe, there is some peribronchovascular ground-glass attenuation air space disease, which likely represents some post obstructive pneumonitis. In the basal segments of the right lower lobe adjacent to this lesion there are areas of lucency surrounded by adjacent regions of ground-glass attenuation separated by geographic margins, most compatible with air trapping related to airway obstruction.  There are several tiny 2-4 mm nodules associated with the major fissures bilaterally, favored to represent subpleural lymph nodes.  Other patchy areas of air trapping are also noted throughout the lungs bilaterally.  No other larger more suspicious appearing pulmonary nodules or masses are otherwise identified.  No pleural effusions.  Upper Abdomen: Unremarkable.  Musculoskeletal: There are no aggressive appearing lytic or blastic lesions noted in the visualized portions of the skeleton. Specifically, the posterior aspects of the right seventh and eighth ribs (which abut this mass) appear normal in appearance.  IMPRESSION: 1.  No evidence of pulmonary embolus. 2.  5.0 x 5.1 x 3.7 cm pleural based mass.  This lesion is relatively well defined, and based on comparison with prior chest radiographs is similar in size to prior study from 02/07/2011. Differential considerations include both benign lesions such as solitary fibrous tumor of the pleura (some of which have malignant behavior) or adenomatoid tumor of the pleura,  and malignant lesions (desmoplastic round cell tumor, synovial sarcoma, etc). Clinical correlation and consideration for biopsy of this lesion is recommended. 3.  Because of mass effect in the adjacent portions of the right lower lobe, there is combination of air trapping and adjacent post obstructive pneumonitis in the right lower lobe, as above. 4.  There are additional scattered areas of air trapping throughout the lungs bilaterally suggesting underlying small airways disease. 5.  In addition, there is some nodularity of the fissures and multiple borderline enlarged and mildly enlarged mediastinal and bilateral hilar lymph nodes.  This appearance is nonspecific, but could suggest an underlying systemic disease such as sarcoidosis. Clinical correlation is recommended.  Original Report Authenticated By: DEtheleen Mayhew M.D.   2D Echo LVEF 60-65% LV Mild concentric hypertrophy abnormal left ventricular relaxation (grade 1 diastolic dysfunction)  Admission HPI:  Patient is a 41y.o. male with a PMHx of HTN, HLD, Tobacco abuse, and lung nodule [facility]. Patient reports the chest pain awoke him from sleep at 5:15 am. He describes the chest pain as squeezing, and radiated to left shoulder. No nausea, vomiting, sweating, or shortness of breath accompanying the chest pain. Patient received 3 Ibuprofen  from his wife. After an hour the chest pain persisted which prompted patient to come to the ER. Patient was given 4 baby asa in the ambulance, along with 2 NTG. Patient continued to have chest pain. It is worse when sitting up or moving side to side. No prior episodes of chest pain in the past. No cough, or history of shortness of breath on exertion. Patient denies recent trauma, or vigorous physical activity. He states he works at [employer] He says the most he will ever lift at work is 10 lbs. Pt was also given morphine in ER which made the  pain dull and less squeezing, though it is still very positional. He also has a history of GERD but states this pain feels different than acid reflux. He is on anti-hypertensives and cholesterol medication which he has not taken in 5 months since moving to [place] because he has not established a new PCP. He denies cough, fever, chills, changes in bowel and urinary habits.    Hospital Course by problem list:  #. Atypical Chest pain   Patient is a current daily smoker who presents with chest pain on admission. Given his risk factors including Tobacco abuse and uncontrolled HTN which put him at risk for ACS and aortic dissection, patient has had extensive cardiac workups. His serial cardiac enzymes, EKG, 2Decho and CTA chest have excluded ischemia cardiac event, PE or aortic dissection. His risk stratification is done and his Hb A1C and Lipid panel are WNL.   He has not had chest pain during the admission and is stable upon discharge.  He will be discharged home with continue follow up at [facility] clinic.  Of note, He has recently moved to [facility] and has not established Primary care. Thus he has not been taking his antihypertensive and antihyperlipidemia medications.    #. 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  His right pleural mass was evaluated by chest CT during this admission, which indicated that the mass was unchanged over last year.   Patient is asymptomatic and will be discharged home today. He is instructed to find out which hospital his biopsy was performed and bring the information to the [facility] clinic. He has a follow up appointment on 08/09/11 at [facility] clinic and we will need to obtain his medical records and send for pulmonology referral as an outpatient.    #. HTN (hypertension)    He has a history of HTN, and has had intolerance to HCTZ in the past. He is supposed to be  on Lasix, however, he has not taken Lasix for at least 5 months due to lack of establishment of Primary care.  His BP ranges between 120-140's/80's with HR 80-90's during this admission while off his antihypertensive medications. Given his grade 1 diastolic dysfunction on his Echo during this admission, will initiate BB to slow HR (which increase diastolic filling time),  reducing myocardial oxygen demand, and, by lowering the blood pressure, causing regression of LVH. Patient will need to follow up with [facility] clinic. Should his BP is still elevated while on BB,  May consider to add ACEI.     #. HLD (hyperlipidemia) Stable, continue statin.   #. Tobacco abuse  Tobacco cessation is done during this admission. 1-800-quit line was given to patient. He will need continuous follow up at [facility] clinic.  Discharge Vitals:  BP 123/83  Pulse 66  Temp(Src) 97.5 F (36.4 C) (Oral)  Resp 20  SpO2 97%  Discharge Labs: No results found for this or any previous visit (from the past 24 hour(s)).  Signed: Chue Berkovich 07/25/2011, 1:09 PM   Time Spent on Discharge: 45 minutes

## 2011-07-27 DIAGNOSIS — J948 Other specified pleural conditions: Secondary | ICD-10-CM | POA: Diagnosis present

## 2011-08-09 ENCOUNTER — Ambulatory Visit (INDEPENDENT_AMBULATORY_CARE_PROVIDER_SITE_OTHER): Payer: Self-pay | Admitting: Internal Medicine

## 2011-08-09 ENCOUNTER — Encounter: Payer: Self-pay | Admitting: Internal Medicine

## 2011-08-09 VITALS — BP 125/82 | HR 71 | Temp 98.0°F | Ht 68.0 in | Wt 360.8 lb

## 2011-08-09 DIAGNOSIS — R222 Localized swelling, mass and lump, trunk: Secondary | ICD-10-CM

## 2011-08-09 DIAGNOSIS — F17200 Nicotine dependence, unspecified, uncomplicated: Secondary | ICD-10-CM

## 2011-08-09 DIAGNOSIS — J948 Other specified pleural conditions: Secondary | ICD-10-CM

## 2011-08-09 DIAGNOSIS — L301 Dyshidrosis [pompholyx]: Secondary | ICD-10-CM | POA: Insufficient documentation

## 2011-08-09 DIAGNOSIS — I10 Essential (primary) hypertension: Secondary | ICD-10-CM

## 2011-08-09 DIAGNOSIS — M722 Plantar fascial fibromatosis: Secondary | ICD-10-CM | POA: Insufficient documentation

## 2011-08-09 DIAGNOSIS — Z720 Tobacco use: Secondary | ICD-10-CM

## 2011-08-09 MED ORDER — ATENOLOL 25 MG PO TABS: 25.0000 mg | ORAL_TABLET | Freq: Every day | ORAL | Status: DC

## 2011-08-09 MED ORDER — NAPROXEN 375 MG PO TABS: 375.0000 mg | ORAL_TABLET | Freq: Two times a day (BID) | ORAL | Status: DC

## 2011-08-09 MED ORDER — TRAMADOL HCL 50 MG PO TABS: 50.0000 mg | ORAL_TABLET | Freq: Four times a day (QID) | ORAL | Status: DC | PRN

## 2011-08-09 MED ORDER — TRIAMCINOLONE ACETONIDE 0.5 % EX OINT: TOPICAL_OINTMENT | Freq: Two times a day (BID) | CUTANEOUS | Status: DC

## 2011-08-09 NOTE — Assessment & Plan Note (Signed)
Could not obtain records of biopsy done in [place] but still is in process of doing so.

## 2011-08-09 NOTE — Assessment & Plan Note (Signed)
History of left foot pain c/w plantar fasciitis. Minimal relief with Motrin. Will try naprosyn and if still with pain, may need steroid injection. Advised avoiding bare foot or walking with flip flops. Also recommended better walking shoes.

## 2011-08-09 NOTE — Assessment & Plan Note (Signed)
States he has cut back considerably, and is continuing to do so.

## 2011-08-09 NOTE — Assessment & Plan Note (Signed)
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 Athletes foot also on differential, but vesicles with pus makes this diagnosis less likely. Will prescribe kenalog, and advised keeping the foot dry, and avoiding irritants like new soaps, etc.

## 2011-08-09 NOTE — Progress Notes (Signed)
Subjective:     Patient ID: Douglas Camacho, male   DOB: 11/18/1970, 41 y.o.   MRN: [id number]  HPI Douglas Camacho is a 41 year old man with hx of HTN, and obesity who presents for hospital follow up. He was recently seen in the hospital for chest pain that was ruled out for ACS.   He reports no further chest pain.   He does however complain of left foot pain that is worse in the mornings, especially when he walks on it. It will get better slightly throughout the day. He wears flip flops in the house and will wear walking tennis shoes at his work place. He tried taking motrin 800 mg to relieve the pain, however did very little. Pain described as sharp and throbbing.   He also complains of itching of both feet. He states little vesicles will break out that are pus filled. He will pop them occasionally.   Patient has no other complaints or concerns today. He denies chest pain, cough, sob, headache, N/V, changes in abdominal and urinary character.   Review of Systems  All other systems reviewed and are negative.       Objective:   Physical Exam  Constitutional: He appears well-developed.  HENT:  Head: Normocephalic.  Neck: Normal range of motion. Neck supple.  Cardiovascular: Normal rate, regular rhythm and normal heart sounds.   Pulmonary/Chest: Effort normal and breath sounds normal.  Abdominal: Soft. Bowel sounds are normal.  Musculoskeletal: Normal range of motion.  Skin:       Hyperpigmented spots over lateral aspects of feet. Dry skin. No erythema or swelling noted

## 2011-08-09 NOTE — Patient Instructions (Signed)
Please continue to take blood pressure medication as directed. Please use steroid cream twice daily on feet. Please take naprosyn twice daily for foot pain and swelling.

## 2011-08-09 NOTE — Assessment & Plan Note (Signed)
Lab Results  Component Value Date   NA 137 07/24/2011   K 4.2 07/24/2011   CL 104 07/24/2011   CO2 25 07/24/2011   BUN 14 07/24/2011   CREATININE 1.05 07/24/2011    BP Readings from Last 3 Encounters:  08/09/11 125/82  07/24/11 123/83  02/08/11 171/59    Assessment: Hypertension control:  controlled  Progress toward goals:  at goal Barriers to meeting goals:  no barriers identified  Plan: Hypertension treatment:  continue current medications

## 2011-11-03 ENCOUNTER — Encounter (HOSPITAL_COMMUNITY): Payer: Self-pay | Admitting: Emergency Medicine

## 2011-11-03 ENCOUNTER — Emergency Department (HOSPITAL_COMMUNITY): Payer: Self-pay

## 2011-11-03 ENCOUNTER — Emergency Department (HOSPITAL_COMMUNITY)
Admission: EM | Admit: 2011-11-03 | Discharge: 2011-11-04 | Disposition: A | Discharge status: Home / Self Care | Payer: Self-pay | Attending: Emergency Medicine | Admitting: Emergency Medicine

## 2011-11-03 DIAGNOSIS — E669 Obesity, unspecified: Secondary | ICD-10-CM | POA: Insufficient documentation

## 2011-11-03 DIAGNOSIS — E7800 Pure hypercholesterolemia, unspecified: Secondary | ICD-10-CM | POA: Insufficient documentation

## 2011-11-03 DIAGNOSIS — K219 Gastro-esophageal reflux disease without esophagitis: Secondary | ICD-10-CM | POA: Insufficient documentation

## 2011-11-03 DIAGNOSIS — M79609 Pain in unspecified limb: Secondary | ICD-10-CM | POA: Insufficient documentation

## 2011-11-03 DIAGNOSIS — M79671 Pain in right foot: Secondary | ICD-10-CM

## 2011-11-03 DIAGNOSIS — I10 Essential (primary) hypertension: Secondary | ICD-10-CM | POA: Insufficient documentation

## 2011-11-03 DIAGNOSIS — F17200 Nicotine dependence, unspecified, uncomplicated: Secondary | ICD-10-CM | POA: Insufficient documentation

## 2011-11-03 NOTE — ED Notes (Signed)
Pt states he dropped a pallet on his L foot while working last Monday.  C/o pain and swelling to L foot.  CMS intact.

## 2011-11-04 MED ORDER — HYDROCODONE-ACETAMINOPHEN 5-325 MG PO TABS
1.0000 | ORAL_TABLET | Freq: Once | ORAL | Status: AC
  Administered 2011-11-04: 1 via ORAL
  Filled 2011-11-04: qty 1

## 2011-11-04 MED ORDER — HYDROCODONE-ACETAMINOPHEN 5-325 MG PO TABS: 1.0000 | ORAL_TABLET | Freq: Four times a day (QID) | ORAL | Status: AC | PRN

## 2011-11-04 MED ORDER — NAPROXEN 500 MG PO TABS: 500.0000 mg | ORAL_TABLET | Freq: Two times a day (BID) | ORAL | Status: AC

## 2011-11-04 NOTE — ED Provider Notes (Signed)
History     CSN: [id number]  Arrival date & time 11/03/11  2053   First MD Initiated Contact with Patient 11/03/11 2324      Chief Complaint  Patient presents with  . Foot Pain    (Consider location/radiation/quality/duration/timing/severity/associated sxs/prior treatment) HPI Comments: Patient presents emergency department with chief complaint of left foot pain.  Onset of symptoms began last Monday after the patient dropped a wooden pallet on his foot.  Patient reports pain with ambulating.  He denies numbness, weakness or tingling of extremity.  The history is provided by the patient.    Past Medical History  Diagnosis Date  . Hypertension   . Hypercholesterolemia   . Bronchitis   . Pneumonia Nov. 2012  . Obesity   . Lung nodule 2000    right sided post. pleural nodule s/p biopsy 2000 where patient was told it was benign  . Pre-diabetes   . Tobacco abuse   . GERD (gastroesophageal reflux disease)   . Chest pain     Past Surgical History  Procedure Date  . Appendectomy     Family History  Problem Relation Age of Onset  . Kidney failure Mother     ESRD on HD for last 23 years  . Hypertension Father   . Diabetes Father     History  Substance Use Topics  . Smoking status: Current Everyday Smoker -- 0.3 packs/day for 11 years    Types: Cigarettes  . Smokeless tobacco: Never Used   Comment: trying to quit  . Alcohol Use: Yes     drinks 4-5 cans of beer daily      Review of Systems  Constitutional: Positive for activity change. Negative for appetite change.  Musculoskeletal: Positive for gait problem. Negative for myalgias.  Skin: Negative for color change, pallor, rash and wound.  Neurological: Negative for dizziness, weakness and numbness.  All other systems reviewed and are negative.    Allergies  Review of patient's allergies indicates no known allergies.  Home Medications  No current outpatient prescriptions on file.  BP 153/91  Pulse 95   Temp 98.2 F (36.8 C) (Oral)  Resp 16  Ht 5\' 8"  (1.727 m)  SpO2 97%  Physical Exam  Nursing note and vitals reviewed. Constitutional: He is oriented to person, place, and time. He appears well-developed and well-nourished. No distress.  HENT:  Head: Normocephalic and atraumatic.  Eyes: Conjunctivae and EOM are normal.  Neck: Normal range of motion.  Cardiovascular:       Intact distal pulses, cap refill < 3 sec  Pulmonary/Chest: Effort normal.  Musculoskeletal: Normal range of motion.       ttp over left metatarsals. No pain with ankle ROM.   Neurological: He is alert and oriented to person, place, and time.  Skin: Skin is warm and dry. No rash noted. He is not diaphoretic.  Psychiatric: He has a normal mood and affect. His behavior is normal.    ED Course  Procedures (including critical care time)  Labs Reviewed - No data to display Dg Foot Complete Left  11/03/2011  *RADIOLOGY REPORT*  Clinical Data: Pain and swelling post blunt trauma.  LEFT FOOT - COMPLETE 3+ VIEW  Comparison: None.  Findings: No radiodense foreign body. Negative for fracture, dislocation, or other acute abnormality.  Normal alignment and mineralization. No significant degenerative change.  Regional soft tissues unremarkable.  IMPRESSION:  Negative  Original Report Authenticated By: Osa Craver, M.D.     No diagnosis  found.    MDM  Left foot pain  Patient X-Ray negative for obvious fracture or dislocation. Pain managed in ED. Pt advised to follow up with orthopedics if symptoms persist for possibility of missed fracture diagnosis. Patient given post op boot while in ED, conservative therapy recommended and discussed. Patient will be dc home & is agreeable with above plan.         Jaci Carrel, [place] 11/04/11 0023

## 2011-11-04 NOTE — Discharge Instructions (Signed)
Be sure to read and understand instructions below prior to leaving the hospital. If your symptoms persist without any improvement in 1 week it is reccommended that you follow up with orthopedics listed above. Use your pain medication as prescribed and do not operate heavy machinery while on pain medication. Note that your pain medication contains acetaminophen (Tylenol) & its is not reccommended that you use additional acetaminophen (Tylenol) while taking this medication.  TREATMENT  Rest, ice, elevation, and compression are the basic modes of treatment.    HOME CARE INSTRUCTIONS  Apply ice to the sore area for 15 to 20 minutes, 3 to 4 times per day. Do this while you are awake for the first 2 days, or as directed. This can be stopped when the swelling goes away. Put the ice in a plastic bag and place a towel between the bag of ice and your skin.  Keep your leg elevated when possible to lessen swelling.  If your caregiver recommends crutches, use them as instructed for 1 week. Then, you may walk on your ankle weight bearing as tolerated.  You may take off your ankle stabilizer at night and to take a shower or bath. Wiggle your toes in the splint several times per day if you are able.  Do not drive a vehicle on pain medication. ACTIVITY:            - Weight bearing as tolerated            - Exercises should be limited to pain free range of motion            - Can start mobilization by tracing the alphabet with your foot in the air.       SEEK MEDICAL CARE IF:  You have an increase in bruising, swelling, or pain.  Your toes feel cold.  Pain relief is not achieved with medications.  EMERGENCY:: Your toes are numb or blue or you have severe pain.  MAKE SURE YOU:  Understand these instructions.  Will watch your condition.  Will get help right away if you are not doing well or get worse   COLD THERAPY DIRECTIONS:  Ice or gel packs can be used to reduce both pain and swelling. Ice is the most  helpful within the first 24 to 48 hours after an injury or flareup from overusing a muscle or joint.  Ice is effective, has very few side effects, and is safe for most people to use.   If you expose your skin to cold temperatures for too long or without the proper protection, you can damage your skin or nerves. Watch for signs of skin damage due to cold.   HOME CARE INSTRUCTIONS  Follow these tips to use ice and cold packs safely.  Place a dry or damp towel between the ice and skin. A damp towel will cool the skin more quickly, so you may need to shorten the time that the ice is used.  For a more rapid response, add gentle compression to the ice.  Ice for no more than 10 to 20 minutes at a time. The bonier the area you are icing, the less time it will take to get the benefits of ice.  Check your skin after 5 minutes to make sure there are no signs of a poor response to cold or skin damage.  Rest 20 minutes or more in between uses.  Once your skin is numb, you can end your treatment. You can test numbness  by very lightly touching your skin. The touch should be so light that you do not see the skin dimple from the pressure of your fingertip. When using ice, most people will feel these normal sensations in this order: cold, burning, aching, and numbness.  Do not use ice on someone who cannot communicate their responses to pain, such as small children or people with dementia.   HOW TO MAKE AN ICE PACK  To make an ice pack, do one of the following:  Place crushed ice or a bag of frozen vegetables in a sealable plastic bag. Squeeze out the excess air. Place this bag inside another plastic bag. Slide the bag into a pillowcase or place a damp towel between your skin and the bag.  Mix 3 parts water with 1 part rubbing alcohol. Freeze the mixture in a sealable plastic bag. When you remove the mixture from the freezer, it will be slushy. Squeeze out the excess air. Place this bag inside another plastic bag.  Slide the bag into a pillowcase or place a damp towel between your sk

## 2011-11-04 NOTE — ED Provider Notes (Signed)
Medical screening examination/treatment/procedure(s) were performed by non-physician practitioner and as supervising physician I was immediately available for consultation/collaboration.  Cheri Guppy, MD 11/04/11 [phone]

## 2013-11-09 ENCOUNTER — Emergency Department (INDEPENDENT_AMBULATORY_CARE_PROVIDER_SITE_OTHER)
Admission: EM | Admit: 2013-11-09 | Discharge: 2013-11-09 | Disposition: A | Discharge status: Home / Self Care | Payer: PRIVATE HEALTH INSURANCE | Source: Home / Self Care | Attending: Family Medicine | Admitting: Family Medicine

## 2013-11-09 ENCOUNTER — Encounter (HOSPITAL_COMMUNITY): Payer: Self-pay | Admitting: Emergency Medicine

## 2013-11-09 DIAGNOSIS — I10 Essential (primary) hypertension: Secondary | ICD-10-CM

## 2013-11-09 DIAGNOSIS — Z202 Contact with and (suspected) exposure to infections with a predominantly sexual mode of transmission: Secondary | ICD-10-CM

## 2013-11-09 MED ORDER — LISINOPRIL 20 MG PO TABS: 20.0000 mg | ORAL_TABLET | Freq: Every day | ORAL | Status: DC

## 2013-11-09 MED ORDER — AMLODIPINE BESYLATE 10 MG PO TABS: 10.0000 mg | ORAL_TABLET | Freq: Every day | ORAL | Status: DC

## 2013-11-09 MED ORDER — METRONIDAZOLE 500 MG PO TABS: 500.0000 mg | ORAL_TABLET | Freq: Two times a day (BID) | ORAL | Status: DC

## 2013-11-09 NOTE — Discharge Instructions (Signed)
Your high blood pressure needs treatment Please restart your lisinopril for one week then restart the norvasc Please start taking the metro for the trichomonas infection We will call you if anything else comes back positive  Trichomoniasis Trichomoniasis is an infection caused by an organism called Trichomonas. The infection can affect both women and men. In women, the outer male genitalia and the vagina are affected. In men, the penis is mainly affected, but the prostate and other reproductive organs can also be involved. Trichomoniasis is a sexually transmitted infection (STI) and is most often passed to another person through sexual contact.  RISK FACTORS  Having unprotected sexual intercourse.  Having sexual intercourse with an infected partner. SIGNS AND SYMPTOMS  Symptoms of trichomoniasis in women include:  Abnormal gray-green frothy vaginal discharge.  Itching and irritation of the vagina.  Itching and irritation of the area outside the vagina. Symptoms of trichomoniasis in men include:   Penile discharge with or without pain.  Pain during urination. This results from inflammation of the urethra. DIAGNOSIS  Trichomoniasis may be found during a Pap test or physical exam. Your health care provider may use one of the following methods to help diagnose this infection:  Examining vaginal discharge under a microscope. For men, urethral discharge would be examined.  Testing the pH of the vagina with a test tape.  Using a vaginal swab test that checks for the Trichomonas organism. A test is available that provides results within a few minutes.  Doing a culture test for the organism. This is not usually needed. TREATMENT   You may be given medicine to fight the infection. Women should inform their health care provider if they could be or are pregnant. Some medicines used to treat the infection should not be taken during pregnancy.  Your health care provider may recommend  over-the-counter medicines or creams to decrease itching or irritation.  Your sexual partner will need to be treated if infected. HOME CARE INSTRUCTIONS   Take medicines only as directed by your health care provider.  Take over-the-counter medicine for itching or irritation as directed by your health care provider.  Do not have sexual intercourse while you have the infection.  Women should not douche or wear tampons while they have the infection.  Discuss your infection with your partner. Your partner may have gotten the infection from you, or you may have gotten it from your partner.  Have your sex partner get examined and treated if necessary.  Practice safe, informed, and protected sex.  See your health care provider for other STI testing. SEEK MEDICAL CARE IF:   You still have symptoms after you finish your medicine.  You develop abdominal pain.  You have pain when you urinate.  You have bleeding after sexual intercourse.  You develop a rash.  Your medicine makes you sick or makes you throw up (vomit). MAKE SURE YOU:  Understand these instructions.  Will watch your condition.  Will get help right away if you are not doing well or get worse. Document Released: 09/04/2000 Document Revised: 07/26/2013 Document Reviewed: 12/21/2012 [facility] Patient Information 2015 [place], [place]. This information is not intended to replace advice given to you by your health care provider. Make sure you discuss any questions you have with your health care provider.

## 2013-11-09 NOTE — ED Provider Notes (Signed)
CSN: [id number]     Arrival date & time 11/09/13  1646 History   First MD Initiated Contact with Patient 11/09/13 1720     Chief Complaint  Patient presents with  . Exposure to STD   (Consider location/radiation/quality/duration/timing/severity/associated sxs/prior Treatment) HPI  STD exposure: wife told pt today that she has trich. Sexually active and not using condoms. Denies syduria, penile pain, frequency, abd pain, swollen lymphnodes.   HTN: lisinopril and norvasc. Ran out of medication in several months. Denies CP, SOb, palpitations, HA. No PCP here in town so has not gotten them refilled.    Past Medical History  Diagnosis Date  . Hypertension   . Hypercholesterolemia   . Bronchitis   . Pneumonia Nov. 2012  . Obesity   . Lung nodule 2000    right sided post. pleural nodule s/p biopsy 2000 where patient was told it was benign  . Pre-diabetes   . Tobacco abuse   . GERD (gastroesophageal reflux disease)   . Chest pain    Past Surgical History  Procedure Laterality Date  . Appendectomy     Family History  Problem Relation Age of Onset  . Kidney failure Mother     ESRD on HD for last 23 years  . Hypertension Father   . Diabetes Father    History  Substance Use Topics  . Smoking status: Current Every Day Smoker -- 0.30 packs/day for 11 years    Types: Cigarettes  . Smokeless tobacco: Never Used     Comment: trying to quit  . Alcohol Use: Yes     Comment: drinks 4-5 cans of beer daily    Review of Systems Per HPI with all other pertinent systems negative.   Allergies  Review of patient's allergies indicates no known allergies.  Home Medications   Prior to Admission medications   Medication Sig Start Date End Date Taking? Authorizing Provider  amLODipine (NORVASC) 10 MG tablet Take 1 tablet (10 mg total) by mouth daily. 11/09/13   Ozella Rocks, MD  lisinopril (PRINIVIL,ZESTRIL) 20 MG tablet Take 1 tablet (20 mg total) by mouth daily. 11/09/13   Ozella Rocks, MD  metroNIDAZOLE (FLAGYL) 500 MG tablet Take 1 tablet (500 mg total) by mouth 2 (two) times daily. 11/09/13   Ozella Rocks, MD   BP 170/98  Pulse 89  Temp(Src) 98.1 F (36.7 C) (Oral)  Resp 16  SpO2 100% Physical Exam  Constitutional: He is oriented to person, place, and time. He appears well-developed and well-nourished. No distress.  HENT:  Head: Normocephalic and atraumatic.  Eyes: EOM are normal. Pupils are equal, round, and reactive to light.  Neck: Normal range of motion.  Cardiovascular: Normal rate, normal heart sounds and intact distal pulses.   No murmur heard. Pulmonary/Chest: Effort normal and breath sounds normal. No respiratory distress.  Abdominal: Soft. He exhibits no distension.  Musculoskeletal: Normal range of motion. He exhibits no tenderness.  Neurological: He is alert and oriented to person, place, and time. No cranial nerve deficit. He exhibits normal muscle tone. Coordination normal.  Skin: Skin is warm. No rash noted. He is not diaphoretic. No erythema.  Psychiatric: He has a normal mood and affect. His behavior is normal. Judgment and thought content normal.    ED Course  Procedures (including critical care time) Labs Review Labs Reviewed - No data to display  Imaging Review No results found.   MDM   1. Trichomonas exposure   2. Essential hypertension  [facility] pending  HTN: pt w/o PCP. Refilled Lisinopril and Norvasc. Pt to start lisniopril first then add norvasc if 7 days if asymptomatic for hypotension  Precautions given and all questions answered  Shelly Flattenavid Merrell, MD Family Medicine 11/09/2013, 6:08 PM      Ozella Rocksavid J Merrell, MD 11/09/13 [phone]

## 2013-11-09 NOTE — ED Notes (Signed)
C/o exposure to std; trichomas  States better half was dx  States he don't have any sx

## 2014-11-28 ENCOUNTER — Emergency Department (HOSPITAL_COMMUNITY): Payer: PRIVATE HEALTH INSURANCE

## 2014-11-28 ENCOUNTER — Emergency Department (HOSPITAL_COMMUNITY)
Admission: EM | Admit: 2014-11-28 | Discharge: 2014-11-28 | Disposition: A | Discharge status: Home / Self Care | Payer: No Typology Code available for payment source | Attending: Emergency Medicine | Admitting: Emergency Medicine

## 2014-11-28 ENCOUNTER — Encounter (HOSPITAL_COMMUNITY): Payer: Self-pay | Admitting: *Deleted

## 2014-11-28 DIAGNOSIS — E669 Obesity, unspecified: Secondary | ICD-10-CM | POA: Insufficient documentation

## 2014-11-28 DIAGNOSIS — Z8719 Personal history of other diseases of the digestive system: Secondary | ICD-10-CM | POA: Insufficient documentation

## 2014-11-28 DIAGNOSIS — Z720 Tobacco use: Secondary | ICD-10-CM | POA: Insufficient documentation

## 2014-11-28 DIAGNOSIS — R918 Other nonspecific abnormal finding of lung field: Secondary | ICD-10-CM | POA: Insufficient documentation

## 2014-11-28 DIAGNOSIS — J948 Other specified pleural conditions: Secondary | ICD-10-CM

## 2014-11-28 DIAGNOSIS — J069 Acute upper respiratory infection, unspecified: Secondary | ICD-10-CM | POA: Insufficient documentation

## 2014-11-28 DIAGNOSIS — I10 Essential (primary) hypertension: Secondary | ICD-10-CM | POA: Insufficient documentation

## 2014-11-28 DIAGNOSIS — Z8701 Personal history of pneumonia (recurrent): Secondary | ICD-10-CM | POA: Insufficient documentation

## 2014-11-28 DIAGNOSIS — Z79899 Other long term (current) drug therapy: Secondary | ICD-10-CM | POA: Insufficient documentation

## 2014-11-28 MED ORDER — AZITHROMYCIN 250 MG PO TABS: 250.0000 mg | ORAL_TABLET | Freq: Every day | ORAL | Status: DC

## 2014-11-28 NOTE — ED Notes (Signed)
PT B/P 166/106 , Pt reports he did not take his BP meds this AM

## 2014-11-28 NOTE — ED Notes (Signed)
Declined W/C at D/C and was escorted to lobby by RN. Pt BP elevated and Pt reports he will take his Blood pressure meds when he goes home.

## 2014-11-28 NOTE — ED Notes (Signed)
PT reports his Sx's started on Thursday with cough ,conjestion and progressed to a productive cough and fever. No fever today at triage.

## 2014-11-28 NOTE — ED Provider Notes (Signed)
CSN: [id number]     Arrival date & time 11/28/14  1308 History   First MD Initiated Contact with Patient 11/28/14 [phone]     Chief Complaint  Patient presents with  . Nasal Congestion  . Cough     (Consider location/radiation/quality/duration/timing/severity/associated sxs/prior Treatment) HPI Comments: Patient presents to the emergency department with chief complaint of cough. Patient states the symptoms started on Thursday. Patient reports associated nasal congestion, and productive cough. He also reports feeling hot, but never measured a temperature. Patient states that he typically gets pneumonia about this time of year. He is concerned that he is getting pneumonia again. He has not taken anything to alleviate his symptoms. There are no aggravating or alleviating factors. He has not taken his blood pressure medicine this morning. He is in everyday smoker.  The history is provided by the patient. No language interpreter was used.    Past Medical History  Diagnosis Date  . Hypertension   . Hypercholesterolemia   . Bronchitis   . Pneumonia Nov. 2012  . Obesity   . Lung nodule 2000    right sided post. pleural nodule s/p biopsy 2000 where patient was told it was benign  . Pre-diabetes   . Tobacco abuse   . GERD (gastroesophageal reflux disease)   . Chest pain    Past Surgical History  Procedure Laterality Date  . Appendectomy     Family History  Problem Relation Age of Onset  . Kidney failure Mother     ESRD on HD for last 23 years  . Hypertension Father   . Diabetes Father    Social History  Substance Use Topics  . Smoking status: Current Every Day Smoker -- 0.30 packs/day for 11 years    Types: Cigarettes  . Smokeless tobacco: Never Used     Comment: trying to quit  . Alcohol Use: Yes     Comment: drinks 4-5 cans of beer daily    Review of Systems  Constitutional: Positive for fever and chills.  HENT: Positive for congestion. Negative for postnasal drip,  rhinorrhea, sinus pressure, sneezing and sore throat.   Respiratory: Positive for cough. Negative for shortness of breath.   Cardiovascular: Negative for chest pain.  Gastrointestinal: Negative for nausea, vomiting, abdominal pain, diarrhea and constipation.  Genitourinary: Negative for dysuria.      Allergies  Review of patient's allergies indicates no known allergies.  Home Medications   Prior to Admission medications   Medication Sig Start Date End Date Taking? Authorizing Provider  amLODipine (NORVASC) 10 MG tablet Take 1 tablet (10 mg total) by mouth daily. 11/09/13   Ozella Rocks, MD  lisinopril (PRINIVIL,ZESTRIL) 20 MG tablet Take 1 tablet (20 mg total) by mouth daily. 11/09/13   Ozella Rocks, MD  metroNIDAZOLE (FLAGYL) 500 MG tablet Take 1 tablet (500 mg total) by mouth 2 (two) times daily. 11/09/13   Ozella Rocks, MD   BP 166/106 mmHg  Pulse 98  Temp(Src) 98 F (36.7 C) (Oral)  Resp 20  Ht 5\' 8"  (1.727 m)  Wt 350 lb (158.759 kg)  BMI 53.23 kg/m2  SpO2 99% Physical Exam  Constitutional: He appears well-developed and well-nourished. No distress.  HENT:  Head: Normocephalic.  Right Ear: External ear normal.  Left Ear: External ear normal.  Mildly erythematous, no tonsillar exudate, no abscess, no stridor, uvula is midline  TMs clear bilaterally  Eyes: Conjunctivae and EOM are normal. Pupils are equal, round, and reactive to light.  Neck:  Normal range of motion. Neck supple.  Cardiovascular: Normal rate, regular rhythm and normal heart sounds.  Exam reveals no gallop and no friction rub.   No murmur heard. Pulmonary/Chest: Effort normal and breath sounds normal. No stridor. No respiratory distress. He has no wheezes. He has no rales. He exhibits no tenderness.  CTAB  Abdominal: Soft. Bowel sounds are normal. He exhibits no distension. There is no tenderness.  Musculoskeletal: Normal range of motion. He exhibits no tenderness.  Neurological: He is alert.   Skin: Skin is warm and dry. No rash noted. He is not diaphoretic.  Psychiatric: He has a normal mood and affect. His behavior is normal. Judgment and thought content normal.  Nursing note and vitals reviewed.   ED Course  Procedures (including critical care time)  Imaging Review Dg Chest 2 View  11/28/2014   CLINICAL DATA:  44 year old male with cough and congestion and shortness of breath for 4 days. History of right pleural mass which was reportedly biopsied in the past at an outside institution.  EXAM: CHEST  2 VIEW  COMPARISON:  07/23/2011 chest CT and chest radiograph. 02/07/2011 chest radiograph  FINDINGS: The cardiomediastinal silhouette is unremarkable.  A posterior right pleural mass has increased in size, now measuring 7.2 x 7.5 cm, previously 5.6 x 5.4 cm in 2013.  There is no evidence of pleural effusion, pneumothorax, airspace disease, pulmonary edema or other pleural abnormality.  Mild chronic peribronchial thickening is noted.  No bony abnormalities are identified.  IMPRESSION: Enlarging right pleural mass - further evaluation/cardiothoracic surgical consultation is recommended.  No evidence of acute cardiopulmonary disease.  Mild chronic peribronchial thickening.   Electronically Signed   By: Harmon Pier M.D.   On: 11/28/2014 08:38   I have personally reviewed and evaluated these images and lab results as part of my medical decision-making.   MDM   Final diagnoses:  URI (upper respiratory infection)  Pleural mass    Patient with symptoms consistent with URI. Additionally, patient has a pleural mass which is increasing in size from prior imaging studies. I discussed patient with Dr. Madilyn Hook, who recommends consultation with cardiothoracic surgery. Appreciate telephone consultation with Dr. Dorris Fetch, who recommends outpatient follow-up. Dr. Sunday Corn office will call the patient to make an appointment. Patient understands and agrees with the plan. He is stable and ready for  discharge.    Roxy Horseman, PA-C 11/28/14 0935  Tilden Fossa, MD 11/28/14 [phone]

## 2014-11-28 NOTE — Discharge Instructions (Signed)
You have been diagnosed with an upper respiratory or a tract infection and a pleural mass. The pleural mass has grown in size from your previous chest x-ray. Please follow-up with the cardiothoracic surgeon as discussed during your visit.  Upper Respiratory Infection, Adult An upper respiratory infection (URI) is also sometimes known as the common cold. The upper respiratory tract includes the nose, sinuses, throat, trachea, and bronchi. Bronchi are the airways leading to the lungs. Most people improve within 1 week, but symptoms can last up to 2 weeks. A residual cough may last even longer.  CAUSES Many different viruses can infect the tissues lining the upper respiratory tract. The tissues become irritated and inflamed and often become very moist. Mucus production is also common. A cold is contagious. You can easily spread the virus to others by oral contact. This includes kissing, sharing a glass, coughing, or sneezing. Touching your mouth or nose and then touching a surface, which is then touched by another person, can also spread the virus. SYMPTOMS  Symptoms typically develop 1 to 3 days after you come in contact with a cold virus. Symptoms vary from person to person. They may include:  Runny nose.  Sneezing.  Nasal congestion.  Sinus irritation.  Sore throat.  Loss of voice (laryngitis).  Cough.  Fatigue.  Muscle aches.  Loss of appetite.  Headache.  Low-grade fever. DIAGNOSIS  You might diagnose your own cold based on familiar symptoms, since most people get a cold 2 to 3 times a year. Your caregiver can confirm this based on your exam. Most importantly, your caregiver can check that your symptoms are not due to another disease such as strep throat, sinusitis, pneumonia, asthma, or epiglottitis. Blood tests, throat tests, and X-rays are not necessary to diagnose a common cold, but they may sometimes be helpful in excluding other more serious diseases. Your caregiver will  decide if any further tests are required. RISKS AND COMPLICATIONS  You may be at risk for a more severe case of the common cold if you smoke cigarettes, have chronic heart disease (such as heart failure) or lung disease (such as asthma), or if you have a weakened immune system. The very young and very old are also at risk for more serious infections. Bacterial sinusitis, middle ear infections, and bacterial pneumonia can complicate the common cold. The common cold can worsen asthma and chronic obstructive pulmonary disease (COPD). Sometimes, these complications can require emergency medical care and may be life-threatening. PREVENTION  The best way to protect against getting a cold is to practice good hygiene. Avoid oral or hand contact with people with cold symptoms. Wash your hands often if contact occurs. There is no clear evidence that vitamin C, vitamin E, echinacea, or exercise reduces the chance of developing a cold. However, it is always recommended to get plenty of rest and practice good nutrition. TREATMENT  Treatment is directed at relieving symptoms. There is no cure. Antibiotics are not effective, because the infection is caused by a virus, not by bacteria. Treatment may include:  Increased fluid intake. Sports drinks offer valuable electrolytes, sugars, and fluids.  Breathing heated mist or steam (vaporizer or shower).  Eating chicken soup or other clear broths, and maintaining good nutrition.  Getting plenty of rest.  Using gargles or lozenges for comfort.  Controlling fevers with ibuprofen or acetaminophen as directed by your caregiver.  Increasing usage of your inhaler if you have asthma. Zinc gel and zinc lozenges, taken in the first 24 hours  of the common cold, can shorten the duration and lessen the severity of symptoms. Pain medicines may help with fever, muscle aches, and throat pain. A variety of non-prescription medicines are available to treat congestion and runny nose.  Your caregiver can make recommendations and may suggest nasal or lung inhalers for other symptoms.  HOME CARE INSTRUCTIONS   Only take over-the-counter or prescription medicines for pain, discomfort, or fever as directed by your caregiver.  Use a warm mist humidifier or inhale steam from a shower to increase air moisture. This may keep secretions moist and make it easier to breathe.  Drink enough water and fluids to keep your urine clear or pale yellow.  Rest as needed.  Return to work when your temperature has returned to normal or as your caregiver advises. You may need to stay home longer to avoid infecting others. You can also use a face mask and careful hand washing to prevent spread of the virus. SEEK MEDICAL CARE IF:   After the first few days, you feel you are getting worse rather than better.  You need your caregiver's advice about medicines to control symptoms.  You develop chills, worsening shortness of breath, or brown or red sputum. These may be signs of pneumonia.  You develop yellow or brown nasal discharge or pain in the face, especially when you bend forward. These may be signs of sinusitis.  You develop a fever, swollen neck glands, pain with swallowing, or white areas in the back of your throat. These may be signs of strep throat. SEEK IMMEDIATE MEDICAL CARE IF:   You have a fever.  You develop severe or persistent headache, ear pain, sinus pain, or chest pain.  You develop wheezing, a prolonged cough, cough up blood, or have a change in your usual mucus (if you have chronic lung disease).  You develop sore muscles or a stiff neck. Document Released: 09/04/2000 Document Revised: 06/03/2011 Document Reviewed: 06/16/2013 [facility] Patient Information 2015 [employer], [place]. This information is not intended to replace advice given to you by your health care provider. Make sure you discuss any questions you have with your health care provider.

## 2014-11-30 ENCOUNTER — Other Ambulatory Visit: Payer: Self-pay | Admitting: *Deleted

## 2014-11-30 DIAGNOSIS — J948 Other specified pleural conditions: Secondary | ICD-10-CM

## 2014-12-02 ENCOUNTER — Other Ambulatory Visit: Payer: Self-pay | Admitting: Thoracic Surgery (Cardiothoracic Vascular Surgery)

## 2014-12-02 ENCOUNTER — Ambulatory Visit
Admission: RE | Admit: 2014-12-02 | Discharge: 2014-12-02 | Disposition: A | Discharge status: Home / Self Care | Payer: No Typology Code available for payment source | Source: Ambulatory Visit | Attending: Thoracic Surgery (Cardiothoracic Vascular Surgery) | Admitting: Thoracic Surgery (Cardiothoracic Vascular Surgery)

## 2014-12-02 DIAGNOSIS — R918 Other nonspecific abnormal finding of lung field: Secondary | ICD-10-CM

## 2014-12-02 DIAGNOSIS — J948 Other specified pleural conditions: Secondary | ICD-10-CM

## 2014-12-05 ENCOUNTER — Encounter: Payer: Self-pay | Admitting: Thoracic Surgery (Cardiothoracic Vascular Surgery)

## 2014-12-05 ENCOUNTER — Institutional Professional Consult (permissible substitution) (INDEPENDENT_AMBULATORY_CARE_PROVIDER_SITE_OTHER): Payer: Self-pay | Admitting: Thoracic Surgery (Cardiothoracic Vascular Surgery)

## 2014-12-05 VITALS — BP 177/98 | HR 100 | Resp 20 | Ht 68.0 in | Wt 360.0 lb

## 2014-12-05 DIAGNOSIS — R222 Localized swelling, mass and lump, trunk: Secondary | ICD-10-CM

## 2014-12-05 DIAGNOSIS — J948 Other specified pleural conditions: Secondary | ICD-10-CM

## 2014-12-05 MED ORDER — LISINOPRIL 20 MG PO TABS: 20.0000 mg | ORAL_TABLET | Freq: Every day | ORAL | Status: DC

## 2014-12-05 MED ORDER — AMLODIPINE BESYLATE 10 MG PO TABS: 10.0000 mg | ORAL_TABLET | Freq: Every day | ORAL | Status: DC

## 2014-12-05 NOTE — Progress Notes (Signed)
PCP is No PCP Per Patient Referring Provider is Tilden Fossa, MD  Chief Complaint  Patient presents with  . Advice Only    Surgical eval on right Pleural mass seen on CXR 11/28/14, Chest CT 12/02/14    HPI: [name] is a 44yo man who is referred from the ED for consultation regarding a right pleural based mass.  [name] is a 44 yo man with a past history of morbid obesity, hypertension, hyperlipidemia, glucose intolerance, tobacco abuse, GERD and a benign mass in his right chest.  He says in 2005 he had a needle biopsy done of a "mass in my lung" in [place] , [place]. He was told the mass was benign.   He went to the ED last week with a complaints of cough, congestion, wheezing and hemoptysis. He also was having fevers, but did not take his temperature. He denies shaking chills and sputum production. He had a CT of the chest which showed a 5.3 x 5.8 cm pleural based mass on the right side. This was increased from a CT in 2013 when it measured 3.4 x 3.9 cm. There was a "tree in bud" infiltrate in the right upper lobe. He was given a prescription for Azithromycin, which he has taken. His symptoms have improved, but not completely resolved.  He has felt fatigued and had a poor appetite for the past couple of weeks. No significant weight loss.  Zubrod Score: At the time of surgery this patient's most appropriate activity status/level should be described as:     0    Normal activity, no symptoms     1    Restricted in physical strenuous activity but ambulatory, able to do out light work     2    Ambulatory and capable of self care, unable to do work activities, up and about >50 % of waking hours                                  3    Only limited self care, in bed greater than 50% of waking hours     4    Completely disabled, no self care, confined to bed or chair     5    Moribund    Past Medical History  Diagnosis Date  . Hypertension   . Hypercholesterolemia   . Bronchitis    . Pneumonia Nov. 2012  . Obesity   . Pre-diabetes   . Tobacco abuse   . GERD (gastroesophageal reflux disease)   . Chest pain   . Pleural mass 2005    bx= benign in [place], [place]    Past Surgical History  Procedure Laterality Date  . Appendectomy      Family History  Problem Relation Age of Onset  . Kidney failure Mother     ESRD on HD for last 23 years  . Hypertension Father   . Diabetes Father   . Diabetes Mother   . Hypertension Mother     Social History Social History  Substance Use Topics  . Smoking status: Current Every Day Smoker -- 0.30 packs/day for 11 years    Types: Cigarettes  . Smokeless tobacco: Never Used     Comment: trying to quit  . Alcohol Use: 0.0 oz/week    0 Standard drinks or equivalent per week     Comment: drinks 4-5 cans of beer daily    Current  Outpatient Prescriptions  Medication Sig Dispense Refill  . amLODipine (NORVASC) 10 MG tablet Take 1 tablet (10 mg total) by mouth daily. 30 tablet 0  . lisinopril (PRINIVIL,ZESTRIL) 20 MG tablet Take 1 tablet (20 mg total) by mouth daily. 30 tablet 0   No current facility-administered medications for this visit.    No Known Allergies  Review of Systems  Constitutional: Positive for fever, activity change, appetite change and fatigue. Negative for chills and unexpected weight change.  HENT: Positive for congestion. Negative for dental problem.   Eyes: Negative for photophobia and visual disturbance.  Respiratory: Positive for cough, shortness of breath and wheezing.        Hemoptysis  Cardiovascular: Positive for leg swelling. Negative for chest pain.  Gastrointestinal: Negative for abdominal pain, blood in stool and abdominal distention.  Endocrine: Negative for cold intolerance and heat intolerance.  Genitourinary: Negative for dysuria and difficulty urinating.  Musculoskeletal: Negative for joint swelling and gait problem.  Neurological: Negative for seizures, syncope and weakness.   Hematological: Negative for adenopathy. Does not bruise/bleed easily.  All other systems reviewed and are negative.   BP 177/98 mmHg  Pulse 100  Resp 20  Ht 5\' 8"  (1.727 m)  Wt 360 lb (163.295 kg)  BMI 54.75 kg/m2  SpO2 96% Physical Exam  Constitutional: He is oriented to person, place, and time. No distress.  Morbidly obese  HENT:  Head: Normocephalic and atraumatic.  Mouth/Throat: No oropharyngeal exudate.  Eyes: Conjunctivae and EOM are normal. No scleral icterus.  Neck: Neck supple. No tracheal deviation present. No thyromegaly present.  Cardiovascular: Normal rate, regular rhythm, normal heart sounds and intact distal pulses.   No murmur heard. Pulmonary/Chest: Effort normal. He has wheezes (faint on right).  Abdominal: Soft. Bowel sounds are normal. He exhibits no distension. There is no tenderness.  Musculoskeletal: Normal range of motion. He exhibits no edema.  Lymphadenopathy:    He has no cervical adenopathy.  Neurological: He is alert and oriented to person, place, and time. No cranial nerve deficit. He exhibits normal muscle tone. Coordination normal.  Skin: Skin is warm and dry.  Psychiatric: He has a normal mood and affect.  Vitals reviewed.    Diagnostic Tests: CT CHEST WITHOUT CONTRAST  TECHNIQUE: Multidetector CT imaging of the chest was performed following the standard protocol without IV contrast.  COMPARISON: CT 06/26/2011  FINDINGS: Mediastinum/Nodes: No axillary or supraclavicular lymphadenopathy. No mediastinal hilar no. Esophagus normal.  Lungs/Pleura: Round smooth mass abutting pleural surface of the RIGHT lower lobe is increased is size. Lesion measures 5.3 by 5.8 cm increased from 3.9 by 3 .4 cm on CT 06/26/2011. Lesion is low-attenuation centrally suggesting fat and/ or fluid (Hounsfield units less 0.) There is a small associated pleural effusion the RIGHT.  In the RIGHT upper lobe there is a fine branching airspace  pattern all  Upper abdomen: Limited view of the liver, kidneys, pancreas are unremarkable. Normal adrenal glands.  Musculoskeletal: No aggressive osseous lesion.  IMPRESSION: 1. Interval enlargement of smoothly marginated mass in the RIGHT lower lobe abutting pleural surface. Lesion has low attenuation suggesting internal fat fluid. 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 Consider MRI with contrast for further evaluation and consider surgical resection.  2. Branching ground-glass nodule pattern in the RIGHT upper lobe suggests pulmonary infection.   Electronically Signed  By: Genevive Bi M.D.  On: 12/02/2014 15:18  I personally reviewed the CT chest and concur with the findings as  noted above  Impression: 44 yo man with a 5.3 x 5.8 cm pleural based mass in the right chest. 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 This mass, while benign, has grown considerably over the past 3 years. There is every reason to suspect it will continue to grow if not removed.  The mass is borderline, but still likely resectable with minimally invasive techniques. If it gets any larger it would likely require a thoracotomy.   I recommended that the mass be removed. I recommended we proceed with Right VATS and resection of pleural mass. I discussed the general nature of the procedure, the need for general anesthesia, and the incisions to be used with Mr and Mrs Kurt. We discussed the expected hospital stay, overall recovery and short and long term outcomes. I reviewed the indications, risks, benefits and alternatives with them. They understand the risks include, but are not limited to death, stroke, MI, DVT/PE, bleeding, possible need for transfusion, infections, and other unforeseeable complications. They understand there is a small risk of recurrence and a risk  of chronic pain.  He wishes to proceed with resection.  He needs to recover from his recent illness before we consider going ahead with surgery. I recommended we wait about a month before proceeding, although it can wait longer if needed.  Smoking cessation instruction/counseling given:  counseled patient on the dangers of tobacco use, advised patient to stop smoking, and reviewed strategies to maximize success   Hypertension- his BP was 177/98 in the office today. He has run out of his BP meds and cannot get them refilled at present because he doesn't currently have a primary care provider. His BP is dangerously high. I gave him prescriptions for amlodipine and lisinopril for one month while he was in the office today.  Plan:  Right VATS, resection of pleural mass. Tentatively scheduled for Monday 01/02/2015  Loreli Slot, MD [facility] [phone]

## 2014-12-07 ENCOUNTER — Other Ambulatory Visit: Payer: Self-pay | Admitting: *Deleted

## 2014-12-07 DIAGNOSIS — I10 Essential (primary) hypertension: Secondary | ICD-10-CM

## 2014-12-07 MED ORDER — AMLODIPINE BESYLATE 10 MG PO TABS: 10.0000 mg | ORAL_TABLET | Freq: Every day | ORAL | Status: DC

## 2014-12-07 MED ORDER — LISINOPRIL 20 MG PO TABS: 20.0000 mg | ORAL_TABLET | Freq: Every day | ORAL | Status: DC

## 2014-12-13 ENCOUNTER — Encounter: Payer: PRIVATE HEALTH INSURANCE | Admitting: Thoracic Surgery (Cardiothoracic Vascular Surgery)

## 2015-01-05 ENCOUNTER — Encounter (HOSPITAL_COMMUNITY): Payer: Self-pay | Admitting: Emergency Medicine

## 2015-01-05 ENCOUNTER — Emergency Department (HOSPITAL_COMMUNITY)
Admission: EM | Admit: 2015-01-05 | Discharge: 2015-01-05 | Disposition: A | Discharge status: Home / Self Care | Payer: Self-pay | Attending: Emergency Medicine | Admitting: Emergency Medicine

## 2015-01-05 DIAGNOSIS — X58XXXD Exposure to other specified factors, subsequent encounter: Secondary | ICD-10-CM | POA: Insufficient documentation

## 2015-01-05 DIAGNOSIS — Y9389 Activity, other specified: Secondary | ICD-10-CM | POA: Insufficient documentation

## 2015-01-05 DIAGNOSIS — Z8719 Personal history of other diseases of the digestive system: Secondary | ICD-10-CM | POA: Insufficient documentation

## 2015-01-05 DIAGNOSIS — Z720 Tobacco use: Secondary | ICD-10-CM | POA: Insufficient documentation

## 2015-01-05 DIAGNOSIS — I10 Essential (primary) hypertension: Secondary | ICD-10-CM | POA: Insufficient documentation

## 2015-01-05 DIAGNOSIS — Y998 Other external cause status: Secondary | ICD-10-CM | POA: Insufficient documentation

## 2015-01-05 DIAGNOSIS — T2040XD Corrosion of unspecified degree of head, face, and neck, unspecified site, subsequent encounter: Secondary | ICD-10-CM

## 2015-01-05 DIAGNOSIS — Y9289 Other specified places as the place of occurrence of the external cause: Secondary | ICD-10-CM | POA: Insufficient documentation

## 2015-01-05 DIAGNOSIS — T2000XD Burn of unspecified degree of head, face, and neck, unspecified site, subsequent encounter: Secondary | ICD-10-CM | POA: Insufficient documentation

## 2015-01-05 DIAGNOSIS — Z23 Encounter for immunization: Secondary | ICD-10-CM | POA: Insufficient documentation

## 2015-01-05 DIAGNOSIS — E669 Obesity, unspecified: Secondary | ICD-10-CM | POA: Insufficient documentation

## 2015-01-05 DIAGNOSIS — Z8701 Personal history of pneumonia (recurrent): Secondary | ICD-10-CM | POA: Insufficient documentation

## 2015-01-05 DIAGNOSIS — Z77098 Contact with and (suspected) exposure to other hazardous, chiefly nonmedicinal, chemicals: Secondary | ICD-10-CM | POA: Insufficient documentation

## 2015-01-05 DIAGNOSIS — H109 Unspecified conjunctivitis: Secondary | ICD-10-CM | POA: Insufficient documentation

## 2015-01-05 MED ORDER — POLYMYXIN B-TRIMETHOPRIM 10000-0.1 UNIT/ML-% OP SOLN
1.0000 [drp] | OPHTHALMIC | Status: DC
  Administered 2015-01-05: 1 [drp] via OPHTHALMIC
  Filled 2015-01-05: qty 10

## 2015-01-05 MED ORDER — TETRACAINE HCL 0.5 % OP SOLN
2.0000 [drp] | Freq: Once | OPHTHALMIC | Status: AC
Start: 2015-01-05 — End: 2015-01-05
  Administered 2015-01-05: 2 [drp] via OPHTHALMIC
  Filled 2015-01-05: qty 2

## 2015-01-05 MED ORDER — TETANUS-DIPHTH-ACELL PERTUSSIS 5-2.5-18.5 LF-MCG/0.5 IM SUSP
0.5000 mL | Freq: Once | INTRAMUSCULAR | Status: AC
  Administered 2015-01-05: 0.5 mL via INTRAMUSCULAR
  Filled 2015-01-05: qty 0.5

## 2015-01-05 MED ORDER — FLUORESCEIN SODIUM 1 MG OP STRP
1.0000 | ORAL_STRIP | Freq: Once | OPHTHALMIC | Status: AC
  Administered 2015-01-05: 1 via OPHTHALMIC
  Filled 2015-01-05: qty 1

## 2015-01-05 MED ORDER — POLYMYXIN B-TRIMETHOPRIM 10000-0.1 UNIT/ML-% OP SOLN: 1.0000 [drp] | OPHTHALMIC | Status: DC

## 2015-01-05 NOTE — ED Notes (Signed)
Pt reports sodium hydroxide was splashed in his right eye Tues while at work. [facility] and told to come to ED for further problems. Pt reports blurred vision to right eye, drainage and pain.

## 2015-01-05 NOTE — Discharge Instructions (Signed)
Do not hesitate to return to the emergency room for any new, worsening or concerning symptoms. ° °Please obtain primary care using resource guide below. Let them know that you were seen in the emergency room and that they will need to obtain records for further outpatient management. ° ° ° °Emergency Department Resource Guide °1) Find a Doctor and Pay Out of Pocket °Although you won't have to find out who is covered by your insurance plan, it is a good idea to ask around and get recommendations. You will then need to call the office and see if the doctor you have chosen will accept you as a new patient and what types of options they offer for patients who are self-pay. Some doctors offer discounts or will set up payment plans for their patients who do not have insurance, but you will need to ask so you aren't surprised when you get to your appointment. ° °2) Contact Your Local Health Department °Not all health departments have doctors that can see patients for sick visits, but many do, so it is worth a call to see if yours does. If you don't know where your local health department is, you can check in your phone book. The CDC also has a tool to help you locate your state's health department, and many state websites also have listings of all of their local health departments. ° °3) Find a Walk-in Clinic °If your illness is not likely to be very severe or complicated, you may want to try a walk in clinic. These are popping up all over the country in pharmacies, drugstores, and shopping centers. They're usually staffed by nurse practitioners or physician assistants that have been trained to treat common illnesses and complaints. They're usually fairly quick and inexpensive. However, if you have serious medical issues or chronic medical problems, these are probably not your best option. ° °No Primary Care Doctor: °- Call [facility] Connect at  832-8000 - they can help you locate a primary care doctor that  accepts your  insurance, provides certain services, etc. °- Physician Referral Service- 1-800-533-3463 ° °Chronic Pain Problems: °Organization         Address  Phone   Notes  °[facility]  (336) 297-2271 Patients need to be referred by their primary care doctor.  ° °Medication Assistance: °Organization         Address  Phone   Notes  °[place] [facility] [address] °[place], [place] [address] (336) 641-8030 --Must be a resident of [place] °-- Must have NO insurance coverage whatsoever (no Medicaid/ Medicare, etc.) °-- The pt. MUST have a primary care doctor that directs their care regularly and follows them in the community °  °[facility]  (866) 331-1348   °[employer]  (888) 892-1162   ° °Agencies that provide inexpensive medical care: °Organization         Address  Phone   Notes  °[facility]  (336) 832-8035   ° Internal Medicine    (336) 832-7272   °[facility] [address] °[place],  [address] (336) 832-4777   °[facility] [address], °[place] (336) 271-4999   °[facility]    (336) 373-0678   °[facility]    (336) 272-1050   °[facility] ° [address], [place] Phone:  (336) 832-4444, Fax:  (336) 832-4440 Hours of Operation:  9 am -   6 pm, M-F.  Also accepts Medicaid/Medicare and self-pay.  °[facility] ° [address], [place] Phone: (336) 832-3150, Fax: (336) 832-3151. Hours of Operation:  8:30 am - 5:30 pm, M-F.  Also accepts Medicaid and self-pay.  °[facility] [place] [address], [place] Phone: (336) 878-6027   °[facility] [address], [place], [place] (336)723-1848, Ext. 123 Mondays & Thursdays: 7-9 AM.  First 15 patients are seen on a first come, first serve basis. °  ° °Medicaid-accepting [place] Providers: ° °Organization          Address  Phone   Notes  °[facility] [address], [place] (336) 641-2100 Also accepts self-pay patients.  °[facility] [address], [place] ° (336) 856-9996   °[facility] [address], [place] (336) 288-8857   °[facility] [address] [place] [address], [place] (336) 299-7000   °Veita Bland [address], [place]  ° (336) 373-1557 Only accepts [place] Access Medicaid patients after they have their name applied to their card.  ° °Self-Pay (no insurance) in [place]: ° °Organization         Address  Phone   Notes  °Sickle Cell Patients, [facility] [address], [place] (336) 832-1970   °[facility] [address], [place] (336) 832-4400   °[facility] ° [address], [place] (336) 992-4800   °[facility]/Dr. Osei-Bonsu ° [address] [place] [address], [place] or [address], [place] (336) 841-8500 Phone number for both [place] and [place] locations is the same.  °[facility] [address], [place] (336) 299-0000   °[facility] [address] [place] [address], [place] or [address] (336) 852-7530 °(336) 878-2260   °[facility] [address], [place] (336) 350-1642, phone; (336) 294-5005, fax Sees patients 1st and 3rd Saturday of every month.  Must not qualify for public or private insurance (i.e. Medicaid, Medicare, [place] Health Choice, Veterans' Benefits) • Household income should be no more than 200% of the poverty level •The clinic cannot treat you if you are pregnant or think you are pregnant • Sexually transmitted diseases are not treated at the clinic.  ° ° °Dental Care: °Organization         Address  Phone  Notes  °[facility] [address],  [place] (336) 641-6152 Accepts children up to age 21 who are enrolled in Medicaid or [place] Health Choice; pregnant women with a Medicaid card; and children who have applied for Medicaid or [place] Health Choice, but were declined, whose parents can pay a reduced fee at time of service.  °[facility] [place]  [address], [place] (336) 641-7733 Accepts children up to age 21 who are enrolled in Medicaid or [place] Health Choice; pregnant women with a Medicaid card; and children who have applied for Medicaid or [place] Health Choice, but were declined, whose parents can pay a reduced fee at time of service.  °[facility] ° [address], [place] (336) 641-4533 Patients are seen by appointment only. Walk-ins are not accepted. [facility] will see patients 18 years of age and   older. °Monday - Tuesday (8am-5pm) °Most Wednesdays (8:30-5pm) °$30 per visit, cash only  °[facility] ° [address], [place] (336) 641-4533 Patients are seen by appointment only. Walk-ins are not accepted. [facility] will see patients 18 years of age and older. °One Wednesday Evening (Monthly: Volunteer Based).  $30 per visit, cash only  °[facility]  (919) 537-3737 for adults; Children under age 4, call Graduate Pediatric Dentistry at ([phone]) [phone]. Children aged 4-14, please call (919) [phone] to request a pediatric application. ° Dental services are provided in all areas of dental care including fillings, crowns and bridges, complete and partial dentures, implants, gum treatment, root canals, and extractions. Preventive care is also provided. Treatment is provided to both adults and children. °Patients are selected via a lottery and there is often a waiting list. °  °[facility] [address], °[place] ° (336) 763-8833 [email or web address] °  °[facility] [address], [place], [place]  (336)723-1848, Ext. 123 Second and Fourth Thursday of each month, opens at 6:30 AM; Clinic ends at 9 AM.  Patients are seen on a first-come first-served basis, and a limited number are seen during each clinic.  ° °[facility] ° [address], [place], [place] (336) 723-7904   Eligibility Requirements °You must have lived in [place], [place], or [place] counties for at least the last three months. °  You cannot be eligible for state or federal sponsored healthcare insurance, including Veterans Administration, Medicaid, or Medicare. °  You generally cannot be eligible for healthcare insurance through your employer.  °  How to apply: °Eligibility screenings are held every Tuesday and Wednesday afternoon from 1:00 pm until 4:00 pm. You do not need an appointment for the interview!  °[facility] [address], [place], [place] 336-631-2330   °[facility] [facility]  336-342-8273   °[place]  336-703-3100   ° [facility]  336-570-6415   ° °Behavioral Health Resources in the Community: °Intensive Outpatient Programs °Organization         Address  Phone  Notes  °[facility] [address], [place], [place] 336-878-6098   °[facility] [address], [place], [place] 336-832-9800   °[facility] [address], [place], [place] ° 336-882-2125   °[facility] [address],  °[place], [place] 1-800-853-5163 or 336-641-4981   °Substance Abuse Resources °Organization         Address  Phone  Notes  °[facility]  336-882-2125   °[facility]  336-784-9470   °[facility]  336-285-9073   °[facility]  336-845-3988   °Residential & Outpatient Substance Abuse Program  1-800-659-3381   °Psychological Services °Organization         Address  Phone  Notes  °[facility]  336- 832-9600   °[facility]  336- 378-7881    °[facility] [address], [place] 1-800-853-5163 or 336-641-4981   ° °Mobile Crisis Teams °Organization         Address  Phone  Notes  °[facility], Mobile Crisis Care Unit  1-877-626-1772   °Assertive °[facility] ° [address] [place], [place] 336-834-9664   °Sharon DeEsch [address] ° [place] 336-554-5454   ° °Self-Help/Support Groups °Organization         Address    Phone             Notes  °[facility] [place] - variety of support groups  336- 373-1402 Call for more information  °Narcotics Anonymous (NA), [facility] [address], °[place]  2 meetings at this location  ° °Residential Treatment Programs °Organization         Address  Phone  Notes  °[facility] [address],    °[place]  1-866-801-8205   °[facility] ° [address], [place], [place] 704-293-8524   °[facility] [address], [place] 336-845-3988 Admissions: 8am-3pm M-F  °[facility] [address]    °[place], [place] 336-841-1104   °[facility] [address], [place], [place] 336-379-7146   °[facility] [address]  °[place], [place] 336-285-9073   °[facility] - Intensive Outpatient [address], [place], [place] 336-852-3033   °[facility] [address]  °[place], [place] 1-877-615-2722 or 336-784-9470   °[facility] [address] [place], [place] 336-227-7417 Accepts Medicaid  °[facility] [address]  ° [place] 1-800-659-3381 Substance Abuse/Addiction Treatment  ° °[place] Behavioral Health Resources °Organization         Address  Phone  Notes  °[facility]  (888) 581-9988   °Julie Brannon, PhD [address] [place], [place]   (336) 349-5553 or (336) 951-0000   °[facility]   [address] °[place], [place] (336) 349-4454   °[facility] [address], [place], [place] (336) 342-8316 Insurance/Medicaid/sponsorship through [facility]  °[facility] [address]                                    [place], [place] (336) 342-8316 Therapy/tele-psych/case  °[facility] [address]  ° [place], [place] (336) 349-2233    °Dr. Arfeen  (336) 349-4544   °[facility] [place]  [employer] [place] [facility] 1) [address], [place] °2) [address], [place] °3)  [address], [place] (336) 349-3220 °(336) 342-7768 ° °(336) 342-8140   °[place] Child Abuse [facility] (336) 342-1394 or (336) 342-3537 (After Hours)    ° ° ° °
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
## 2016-02-28 ENCOUNTER — Emergency Department (HOSPITAL_COMMUNITY)
Admission: EM | Admit: 2016-02-28 | Discharge: 2016-02-29 | Disposition: A | Discharge status: Home / Self Care | Payer: Self-pay | Attending: Emergency Medicine | Admitting: Emergency Medicine

## 2016-02-28 ENCOUNTER — Emergency Department (HOSPITAL_COMMUNITY): Payer: Self-pay

## 2016-02-28 ENCOUNTER — Encounter (HOSPITAL_COMMUNITY): Payer: Self-pay | Admitting: *Deleted

## 2016-02-28 DIAGNOSIS — R0602 Shortness of breath: Secondary | ICD-10-CM | POA: Insufficient documentation

## 2016-02-28 DIAGNOSIS — Z79899 Other long term (current) drug therapy: Secondary | ICD-10-CM | POA: Insufficient documentation

## 2016-02-28 DIAGNOSIS — M25562 Pain in left knee: Secondary | ICD-10-CM | POA: Insufficient documentation

## 2016-02-28 DIAGNOSIS — I10 Essential (primary) hypertension: Secondary | ICD-10-CM | POA: Insufficient documentation

## 2016-02-28 LAB — CBC WITH DIFFERENTIAL/PLATELET
BASOS PCT: 0 %
Basophils Absolute: 0 10*3/uL (ref 0.0–0.1)
EOS ABS: 0.4 10*3/uL (ref 0.0–0.7)
EOS PCT: 5 %
HCT: 34.7 % — ABNORMAL LOW (ref 39.0–52.0)
Hemoglobin: 11.6 g/dL — ABNORMAL LOW (ref 13.0–17.0)
Lymphocytes Relative: 28 %
Lymphs Abs: 2.1 10*3/uL (ref 0.7–4.0)
MCH: 30.1 pg (ref 26.0–34.0)
MCHC: 33.4 g/dL (ref 30.0–36.0)
MCV: 89.9 fL (ref 78.0–100.0)
MONO ABS: 0.5 10*3/uL (ref 0.1–1.0)
MONOS PCT: 6 %
NEUTROS PCT: 61 %
Neutro Abs: 4.5 10*3/uL (ref 1.7–7.7)
Platelets: 164 10*3/uL (ref 150–400)
RBC: 3.86 MIL/uL — ABNORMAL LOW (ref 4.22–5.81)
RDW: 13.4 % (ref 11.5–15.5)
WBC: 7.4 10*3/uL (ref 4.0–10.5)

## 2016-02-28 LAB — I-STAT TROPONIN, ED: TROPONIN I, POC: 0 ng/mL (ref 0.00–0.08)

## 2016-02-28 LAB — COMPREHENSIVE METABOLIC PANEL
ALBUMIN: 3.1 g/dL — AB (ref 3.5–5.0)
ALT: 24 U/L (ref 17–63)
ANION GAP: 5 (ref 5–15)
AST: 23 U/L (ref 15–41)
Alkaline Phosphatase: 52 U/L (ref 38–126)
BUN: 15 mg/dL (ref 6–20)
CO2: 27 mmol/L (ref 22–32)
Calcium: 8.8 mg/dL — ABNORMAL LOW (ref 8.9–10.3)
Chloride: 110 mmol/L (ref 101–111)
Creatinine, Ser: 1.18 mg/dL (ref 0.61–1.24)
GFR calc non Af Amer: >60 mL/min (ref >60)
GLUCOSE: 102 mg/dL — AB (ref 65–99)
POTASSIUM: 3.5 mmol/L (ref 3.5–5.1)
SODIUM: 142 mmol/L (ref 135–145)
TOTAL PROTEIN: 6.3 g/dL — AB (ref 6.5–8.1)
Total Bilirubin: 0.3 mg/dL (ref 0.3–1.2)

## 2016-02-28 LAB — BRAIN NATRIURETIC PEPTIDE: B Natriuretic Peptide: 24.8 pg/mL (ref 0.0–100.0)

## 2016-02-28 MED ORDER — NAPROXEN 500 MG PO TABS: 500.0000 mg | ORAL_TABLET | Freq: Two times a day (BID) | ORAL | 0 refills | Status: DC

## 2016-02-28 MED ORDER — HYDRALAZINE HCL 100 MG PO TABS: 100.0000 mg | ORAL_TABLET | Freq: Two times a day (BID) | ORAL | 1 refills | Status: DC

## 2016-02-28 MED ORDER — FUROSEMIDE 20 MG PO TABS: 20.0000 mg | ORAL_TABLET | Freq: Two times a day (BID) | ORAL | 1 refills | Status: DC

## 2016-02-28 MED ORDER — SPIRONOLACTONE 25 MG PO TABS: 25.0000 mg | ORAL_TABLET | Freq: Two times a day (BID) | ORAL | 1 refills | Status: DC

## 2016-02-28 MED ORDER — HYDRALAZINE HCL 25 MG PO TABS
100.0000 mg | ORAL_TABLET | Freq: Once | ORAL | Status: AC
  Administered 2016-02-29: 100 mg via ORAL
  Filled 2016-02-28: qty 4

## 2016-02-28 MED ORDER — FUROSEMIDE 20 MG PO TABS
40.0000 mg | ORAL_TABLET | Freq: Once | ORAL | Status: AC
  Administered 2016-02-29: 40 mg via ORAL
  Filled 2016-02-28: qty 2

## 2016-02-28 MED ORDER — ATENOLOL 50 MG PO TABS: 50.0000 mg | ORAL_TABLET | Freq: Every day | ORAL | 1 refills | Status: DC

## 2016-02-28 MED ORDER — OMEPRAZOLE 20 MG PO CPDR: 20.0000 mg | DELAYED_RELEASE_CAPSULE | Freq: Every day | ORAL | 1 refills | Status: DC

## 2016-02-28 MED ORDER — SPIRONOLACTONE 25 MG PO TABS
25.0000 mg | ORAL_TABLET | Freq: Every day | ORAL | Status: DC
  Administered 2016-02-29: 25 mg via ORAL
  Filled 2016-02-28: qty 1

## 2016-02-28 NOTE — Discharge Instructions (Signed)
1. Medications: Atenolol, furosemide, hydralazine, spironolactone, Naprosyn usual home medications 2. Treatment: rest, drink plenty of fluids, use Ace wrap for left knee pain, take medications as prescribed 3. Follow Up: Please followup with your primary doctor in 2-3 days for discussion of your diagnoses and further evaluation after today's visit; if you do not have a primary care doctor use the resource guide provided to find one; Please return to the ER for worsening dyspnea, fever, hemoptysis, worsening shortness of breath, development of chest pain or other concerns.

## 2016-02-28 NOTE — ED Triage Notes (Addendum)
Pt has had a headache with LLE x 3 days. Pt is supposed to be taking Spironolactone, hydralazine, and furosemide for blood pressure and fluid retention. Pt has been out of hydralazine and furosemide; does not have a doctor here.

## 2016-02-28 NOTE — Progress Notes (Signed)
Orthopedic Tech Progress Note Patient Details:  Genelle BalRamon Randazzo 03/28/1970 [id number]  Ortho Devices Type of Ortho Device: Ace wrap Ortho Device/Splint Location: lle knee Ortho Device/Splint Interventions: Ordered, Application   Trinna PostMartinez, Bassam Dresch J 02/28/2016, 11:55 PM

## 2016-02-28 NOTE — ED Provider Notes (Signed)
[facility]-EMERGENCY DEPT Provider Note   CSN: [id number] Arrival date & time: 02/28/16  1909     History   Chief Complaint Chief Complaint  Patient presents with  . Hypertension  . Knee Pain    HPI Douglas Camacho is a 45 y.o. male with a hx of GERD, HTN, pre-diabetes presents to the Emergency Department with multiple complaints.   He reports taking multiple medications for HTN, but he has been out of his furosemide, atenolol and hydralazine x 5 days.  He is taking spironolactone, but missed this morning's dose.  He reports no PCP here in [place].  He [place] recently moving from [place].  He states 3 days of mild DOE While lifting heavy rolls of foam at work and leg swelling.  Patient reports that he's had an increase in salt in his diet as well. He states he knows he is supposed to be on a low-salt diet but has not been compliant with this.  Patient also complaining of the left leg pain in his knee. Swelling is bilateral and symmetric. No unilateral swelling or erythema. No recent recent immobilization or surgeries. Patient has never had a DVT.  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  He has taken naproxen and oxycodone with relief. No numbness or tingling, no weakness. No falls or other known trauma.  The history is provided by the patient and the spouse. No language interpreter was used.    Past Medical History:  Diagnosis Date  . Bronchitis   . Chest pain   . GERD (gastroesophageal reflux disease)   . Hypercholesterolemia   . Hypertension   . Obesity   . Pleural mass 2005   bx= benign in [place]  . Pneumonia Nov. 2012  . Pre-diabetes   . Tobacco abuse     Patient Active Problem List   Diagnosis Date Noted  . Plantar fasciitis of left foot 08/09/2011  . 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 right Pleural mass 07/27/2011  . HTN (hypertension) 07/23/2011  . HLD (hyperlipidemia) 07/23/2011  . Obesity  07/23/2011  . Tobacco abuse 07/23/2011    Past Surgical History:  Procedure Laterality Date  . APPENDECTOMY         Home Medications    Prior to Admission medications   Medication Sig Start Date End Date Taking? Authorizing Provider  atenolol (TENORMIN) 50 MG tablet Take 1 tablet (50 mg total) by mouth daily. 02/28/16   Tannah Dreyfuss, PA-C  furosemide (LASIX) 20 MG tablet Take 1 tablet (20 mg total) by mouth 2 (two) times daily. 02/28/16   Imani Sherrin, PA-C  hydrALAZINE (APRESOLINE) 100 MG tablet Take 1 tablet (100 mg total) by mouth 2 (two) times daily. 02/28/16   Bracy Pepper, PA-C  naproxen (NAPROSYN) 500 MG tablet Take 1 tablet (500 mg total) by mouth 2 (two) times daily with a meal. 02/28/16   Jesseka Drinkard, PA-C  omeprazole (PRILOSEC) 20 MG capsule Take 1 capsule (20 mg total) by mouth daily. 02/28/16   Tavie Haseman, PA-C  spironolactone (ALDACTONE) 25 MG tablet Take 1 tablet (25 mg total) by mouth 2 (two) times daily. 02/28/16   Dahlia ClientHannah Sharol Croghan, PA-C    Family History Family History  Problem Relation Age of Onset  . Kidney failure Mother     ESRD on HD for last 23 years  . Diabetes Mother   . Hypertension Mother   . Hypertension Father   .  Diabetes Father     Social History Social History  Substance Use Topics  . Smoking status: Never Smoker  . Smokeless tobacco: Never Used     Comment: trying to quit  . Alcohol use No     Comment: drinks 4-5 cans of beer daily     Allergies   Patient has no known allergies.   Review of Systems Review of Systems  Respiratory: Positive for shortness of breath ( With exertion). Negative for cough, chest tightness and wheezing.   Cardiovascular: Positive for leg swelling. Negative for chest pain.  Gastrointestinal: Negative for abdominal pain, nausea and vomiting.  All other systems reviewed and are negative.    Physical Exam Updated Vital Signs BP 161/91 (BP Location: Right Arm)    Pulse 76   Temp 97.9 F (36.6 C) (Oral)   Resp 19   SpO2 98%   Physical Exam  Constitutional: He appears well-developed and well-nourished. No distress.  Awake, alert, nontoxic appearance  HENT:  Head: Normocephalic and atraumatic.  Mouth/Throat: Oropharynx is clear and moist. No oropharyngeal exudate.  Eyes: Conjunctivae are normal. No scleral icterus.  Neck: Normal range of motion. Neck supple.  Cardiovascular: Normal rate, regular rhythm and intact distal pulses.   Pulmonary/Chest: Effort normal. No respiratory distress. He has decreased breath sounds (slightly decreased throughout). He has no wheezes.  Equal chest expansion  Abdominal: Soft. Bowel sounds are normal. He exhibits no mass. There is no tenderness. There is no rebound and no guarding.  Musculoskeletal: Normal range of motion. He exhibits edema ( 2+ pitting edema in the bilateral lower extremities from the distal forefoot to the midcalf).  Neurological: He is alert.  Speech is clear and goal oriented Moves extremities without ataxia  Skin: Skin is warm and dry. He is not diaphoretic.  Psychiatric: He has a normal mood and affect.  Nursing note and vitals reviewed.    ED Treatments / Results  Labs (all labs ordered are listed, but only abnormal results are displayed) Labs Reviewed  CBC WITH DIFFERENTIAL/PLATELET - Abnormal; Notable for the following:       Result Value   RBC 3.86 (*)    Hemoglobin 11.6 (*)    HCT 34.7 (*)    All other components within normal limits  COMPREHENSIVE METABOLIC PANEL - Abnormal; Notable for the following:    Glucose, Bld 102 (*)    Calcium 8.8 (*)    Total Protein 6.3 (*)    Albumin 3.1 (*)    All other components within normal limits  BRAIN NATRIURETIC PEPTIDE  I-STAT TROPOININ, ED    EKG  EKG Interpretation  Date/Time:  Wednesday February 28 2016 22:49:43 EST Ventricular Rate:  71 PR Interval:    QRS Duration: 86 QT Interval:  394 QTC Calculation: 429 R  Axis:   58 Text Interpretation:  Sinus rhythm Borderline T abnormalities, inferior leads No STEMI.  Confirmed by LONG MD, JOSHUA [phone] on 02/28/2016 10:55:00 PM       Radiology Dg Chest 2 View  Result Date: 02/28/2016 CLINICAL DATA:  Dyspnea for 2 days. EXAM: CHEST  2 VIEW COMPARISON:  11/28/2014 FINDINGS: Unchanged posterior right hemi thorax mass. This measures approximately 7.5 cm. The lungs are otherwise clear. Hilar and mediastinal contours are unremarkable and unchanged. Normal heart size. Normal pulmonary vasculature. No pleural effusions. IMPRESSION: Unchanged right hemi thorax mass. No acute cardiopulmonary findings. Electronically Signed   By: Ellery Plunkaniel R Mitchell M.D.   On: 02/28/2016 22:32   Dg Knee Complete  4 Views Left  Result Date: 02/28/2016 CLINICAL DATA:  Left knee pain and swelling. hears popping when walks. Currently limping x1 week. No known injury. EXAM: LEFT KNEE - COMPLETE 4+ VIEW COMPARISON:  None. FINDINGS: There is medial femorotibial joint space narrowing. No acute fracture, intra-articular loose body nor bone destruction. No joint effusion. Slight joint space narrowing of the patellofemoral compartment is suggested. IMPRESSION: Slight degenerative joint space narrowing of the patellofemoral and femorotibial compartments of the left knee. No acute osseous abnormality. Electronically Signed   By: Tollie Eth M.D.   On: 02/28/2016 22:39    Procedures Procedures (including critical care time)  Medications Ordered in ED Medications  furosemide (LASIX) tablet 40 mg (not administered)  spironolactone (ALDACTONE) tablet 25 mg (not administered)  hydrALAZINE (APRESOLINE) tablet 100 mg (not administered)     Initial Impression / Assessment and Plan / ED Course  I have reviewed the triage vital signs and the nursing notes.  Pertinent labs & imaging results that were available during my care of the patient were reviewed by me and considered in my medical decision making  (see chart for details).  Clinical Course as of Mar 01 19  Wed Feb 28, 2016  2353 Arthritis noted DG Knee Complete 4 Views Left [HM]  2353 Large mass in the right chest noted. This appears unchanged from previous. Record review shows the patient saw CT surgery in 2016 who recommended a VATS procedure. Patient was unable to have this procedure due to a loss of insurance. Long discussion with patient about need for follow-up CT scan. It was offered here in the emergency department but patient declines wishing for outpatient management. He is without fever, chills or hemoptysis. Doubt underlying pneumonia. No tachycardia, chest pain, palpitations. Doubt pulmonary embolism. DG Chest 2 View [HM]  2355 Mild anemia noted when compared to 2013. Patient reports he is taking iron supplements at home. Recommend that he continue. Hemoglobin: (!) 11.6 [HM]  2356 Within normal limits Creatinine: 1.18 [HM]  2356 Within normal limits B Natriuretic Peptide: 24.8 [HM]  2356 Negative Troponin i, poc: 0.00 [HM]  2356 Unchanged from previous on 07/23/2011 EKG 12-Lead [HM]  Thu Feb 29, 2016  0000 Tachycardia noted at triage however none on clinical exam and patient's heart rate has remained in the 70s throughout his time with me MAP (mmHg): 111 [HM]    Clinical Course User Index [HM] Dierdre Forth, PA-C  Patient presents with bilateral lower leg edema and some mild dyspnea on exertion. Labs today are reassuring with a normal BNP. Edema and fluid overload are likely secondary to increased salt intake and lack of furosemide. Patient given dose of his home medications including lasix the emergency department and refills were written for this and other hypertension medications. Patient is without chest pain or shortness of breath at rest. Lungs are clear. Chest x-ray shows stable mass but no signs of pulmonary edema. Discussed inpatient versus outpatient CT scan and patient declines CT scan tonight. Doubt PE or  pneumonia. He is well appearing without hypoxia. No hemoptysis or fever.   BP 121/69   Pulse 75   Temp 97.9 F (36.6 C) (Oral)   Resp 19   SpO2 100%   Final Clinical Impressions(s) / ED Diagnoses   Final diagnoses:  Essential hypertension  Acute pain of left knee    New Prescriptions New Prescriptions   NAPROXEN (NAPROSYN) 500 MG TABLET    Take 1 tablet (500 mg total) by mouth 2 (two) times  daily with a meal.     Dierdre Forth, PA-C 02/29/16 0021    Maia Plan, MD 02/29/16 0930

## 2016-02-29 NOTE — ED Notes (Signed)
Pt stable, understands discharge instructions, and reasons for return.   

## 2016-03-05 ENCOUNTER — Inpatient Hospital Stay: Payer: Self-pay

## 2016-03-13 ENCOUNTER — Encounter: Payer: Self-pay | Admitting: Physician Assistant

## 2016-03-13 ENCOUNTER — Ambulatory Visit: Payer: Self-pay | Attending: Internal Medicine | Admitting: Physician Assistant

## 2016-03-13 VITALS — BP 146/81 | HR 73 | Temp 98.2°F | Ht 68.0 in | Wt 359.8 lb

## 2016-03-13 DIAGNOSIS — G8929 Other chronic pain: Secondary | ICD-10-CM | POA: Insufficient documentation

## 2016-03-13 DIAGNOSIS — E669 Obesity, unspecified: Secondary | ICD-10-CM | POA: Insufficient documentation

## 2016-03-13 DIAGNOSIS — R7303 Prediabetes: Secondary | ICD-10-CM | POA: Insufficient documentation

## 2016-03-13 DIAGNOSIS — Z6841 Body Mass Index (BMI) 40.0 and over, adult: Secondary | ICD-10-CM | POA: Insufficient documentation

## 2016-03-13 DIAGNOSIS — E7800 Pure hypercholesterolemia, unspecified: Secondary | ICD-10-CM | POA: Insufficient documentation

## 2016-03-13 DIAGNOSIS — M25562 Pain in left knee: Secondary | ICD-10-CM | POA: Insufficient documentation

## 2016-03-13 DIAGNOSIS — Z23 Encounter for immunization: Secondary | ICD-10-CM

## 2016-03-13 DIAGNOSIS — R222 Localized swelling, mass and lump, trunk: Secondary | ICD-10-CM | POA: Insufficient documentation

## 2016-03-13 DIAGNOSIS — Z79899 Other long term (current) drug therapy: Secondary | ICD-10-CM | POA: Insufficient documentation

## 2016-03-13 DIAGNOSIS — I10 Essential (primary) hypertension: Secondary | ICD-10-CM | POA: Insufficient documentation

## 2016-03-13 DIAGNOSIS — K219 Gastro-esophageal reflux disease without esophagitis: Secondary | ICD-10-CM | POA: Insufficient documentation

## 2016-03-13 DIAGNOSIS — D649 Anemia, unspecified: Secondary | ICD-10-CM | POA: Insufficient documentation

## 2016-03-13 DIAGNOSIS — R0609 Other forms of dyspnea: Secondary | ICD-10-CM | POA: Insufficient documentation

## 2016-03-13 DIAGNOSIS — R600 Localized edema: Secondary | ICD-10-CM | POA: Insufficient documentation

## 2016-03-13 DIAGNOSIS — D509 Iron deficiency anemia, unspecified: Secondary | ICD-10-CM

## 2016-03-13 LAB — BASIC METABOLIC PANEL
BUN: 14 mg/dL (ref 7–25)
CO2: 28 mmol/L (ref 20–31)
Calcium: 8.8 mg/dL (ref 8.6–10.3)
Chloride: 107 mmol/L (ref 98–110)
Creat: 0.87 mg/dL (ref 0.60–1.35)
GLUCOSE: 98 mg/dL (ref 65–99)
POTASSIUM: 4.5 mmol/L (ref 3.5–5.3)
SODIUM: 142 mmol/L (ref 135–146)

## 2016-03-13 LAB — IRON: IRON: 63 ug/dL (ref 50–180)

## 2016-03-13 LAB — IBC PANEL
%SAT: 19 % (ref 15–60)
TIBC: 329 ug/dL (ref 250–425)
UIBC: 266 ug/dL (ref 125–400)

## 2016-03-13 MED ORDER — HYDRALAZINE HCL 100 MG PO TABS
100.0000 mg | ORAL_TABLET | Freq: Two times a day (BID) | ORAL | 3 refills | Status: DC
Start: 2016-03-13 — End: 2016-03-29

## 2016-03-13 MED ORDER — SPIRONOLACTONE 25 MG PO TABS: 25.0000 mg | ORAL_TABLET | Freq: Two times a day (BID) | ORAL | 3 refills | Status: DC

## 2016-03-13 MED ORDER — FUROSEMIDE 20 MG PO TABS: 40.0000 mg | ORAL_TABLET | Freq: Two times a day (BID) | ORAL | 3 refills | Status: DC

## 2016-03-13 MED ORDER — NAPROXEN 500 MG PO TABS: 500.0000 mg | ORAL_TABLET | Freq: Two times a day (BID) | ORAL | 3 refills | Status: AC

## 2016-03-13 MED ORDER — FERROUS SULFATE 325 (65 FE) MG PO TABS: 325.0000 mg | ORAL_TABLET | Freq: Two times a day (BID) | ORAL | 3 refills | Status: DC

## 2016-03-13 MED ORDER — OMEPRAZOLE 20 MG PO CPDR: 20.0000 mg | DELAYED_RELEASE_CAPSULE | Freq: Every day | ORAL | 3 refills | Status: AC

## 2016-03-13 MED ORDER — ATENOLOL 50 MG PO TABS: 50.0000 mg | ORAL_TABLET | Freq: Every day | ORAL | 3 refills | Status: DC

## 2016-03-13 MED FILL — ?SPIRONOLACTONE 25 MG TABLE: 25 MG | 30 days supply | Qty: 60 | Fill #0

## 2016-03-13 MED FILL — OMEPRAZOLE DR 20 MG CAPSULE: 20 | 30 days supply | Qty: 30 | Fill #0

## 2016-03-13 MED FILL — hydrALAZINE HCL 100 MG TABS: 100 | 30 days supply | Qty: 60 | Fill #0

## 2016-03-13 MED FILL — FUROSEMIDE 20 MG TABLET: 20 | 30 days supply | Qty: 120 | Fill #0

## 2016-03-13 MED FILL — NAPROXEN 500 MG TABLET: 500 | 15 days supply | Qty: 30 | Fill #0

## 2016-03-13 NOTE — Progress Notes (Signed)
Douglas BalRamon Glastetter  ZOX:[id number]  [id number]  DOB - 10/14/1970  Chief Complaint  Patient presents with  . Knee Pain  . Hospitalization Follow-up       Subjective:   Douglas Camacho is a 45 y.o. male here today for establishment of care. He has a PMHx of GERD, hypertension, prediabetes mellitus, and HL. He presented to the emergency department on 02/28/2016 with multiple complaints.  1. Medication refills. He did not have a primary care provider since 2012. He intermittently will receive medications here and there but had not consistently been having his medications field. He was given a 30 day supply of all medications in the emergency department.  2. Dyspnea on exertion and lower extremity edema. No acute process on his chest x-ray. His peptide was within normal limits. He was noted to have a right hemithorax mass. This dates back to early 2000. At some point he had a biopsy done in [place] and was told this was benign. He also has been seen by the CT surgeons in the past and they wanted to do VATS that he lacked insurance at that time. This is been lost to follow-up. A CT scan of his chest was offered in the emergency department as well as an MRI but he declined secondary to his insurance issues.  3. Left knee pain and swelling. 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 His been swollen more lately. There was a knee x-ray done which showed no fracture. He does have degenerative joint disease with joint space narrowing. He was treated with Naprosyn. He has intermittent relief from Naprosyn.  4. He also was told he was anemic. His hemoglobin was 11.6. He's been taking some supplemental iron over-the-counter.  ROS: GEN: denies fever or chills, denies change in weight Skin: denies lesions or rashes HEENT: denies headache, earache, epistaxis, sore throat, or neck pain LUNGS: denies SHOB, dyspnea, PND, orthopnea CV: denies CP or palpitations ABD: denies  abd pain, N or V EXT: denies muscle spasms or swelling; + pain in lower ext, no weakness NEURO: denies numbness or tingling, denies sz, stroke or TIA   ALLERGIES: No Known Allergies  PAST MEDICAL HISTORY: Past Medical History:  Diagnosis Date  . Bronchitis   . Chest pain   . GERD (gastroesophageal reflux disease)   . Hypercholesterolemia   . Hypertension   . Obesity   . Pleural mass 2005   bx= benign in [place]  . Pneumonia Nov. 2012  . Pre-diabetes   . Tobacco abuse     PAST SURGICAL HISTORY: Past Surgical History:  Procedure Laterality Date  . APPENDECTOMY      MEDICATIONS AT HOME: Prior to Admission medications   Medication Sig Start Date End Date Taking? Authorizing Provider  atenolol (TENORMIN) 50 MG tablet Take 1 tablet (50 mg total) by mouth daily. 03/13/16  Yes Zakk Borgen Netta CedarsS Tiernan Suto, PA-C  furosemide (LASIX) 20 MG tablet Take 2 tablets (40 mg total) by mouth 2 (two) times daily. 03/13/16  Yes Jaydy Fitzhenry Netta CedarsS Shivank Pinedo, PA-C  hydrALAZINE (APRESOLINE) 100 MG tablet Take 1 tablet (100 mg total) by mouth 2 (two) times daily. 03/13/16  Yes Kaidynce Pfister Netta CedarsS Reshunda Strider, PA-C  naproxen (NAPROSYN) 500 MG tablet Take 1 tablet (500 mg total) by mouth 2 (two) times daily with a meal. 03/13/16  Yes Miriah Maruyama S Jadan Hinojos, PA-C  spironolactone (ALDACTONE) 25 MG tablet Take 1 tablet (25 mg total) by mouth 2 (two) times daily. 03/13/16  Yes Cassy Sprowl  Netta CedarsS Jamilee Lafosse, PA-C  omeprazole (PRILOSEC) 20 MG capsule Take 1 capsule (20 mg total) by mouth daily. 03/13/16   Vivianne Masteriffany S Callaway Hardigree, PA-C     Objective:   Vitals:   03/13/16 0932  BP: (!) 146/81  Pulse: 73  Temp: 98.2 F (36.8 C)  TempSrc: Oral  SpO2: 98%  Weight: (!) 359 lb 12.8 oz (163.2 kg)  Height: 5\' 8"  (1.727 m)    Exam General appearance : Awake, alert, not in any distress. Speech Clear. Not toxic looking HEENT: Atraumatic and Normocephalic, pupils equally reactive to light and accomodation Neck: supple, no JVD. No cervical lymphadenopathy.  Chest:Good  air entry bilaterally, no added sounds  CVS: S1 S2 regular, no murmurs.  Abdomen: Bowel sounds present, Non tender and not distended with no gaurding, rigidity or rebound. Extremities: B/L Lower Ext shows no edema, both legs are warm to touch Neurology: Awake alert, and oriented X 3, CN II-XII intact, Non focal Skin:No Rash Wounds:N/A  Data Review Lab Results  Component Value Date   HGBA1C 5.8 (H) 07/23/2011     Assessment & Plan  1. HTN  -Refilled meds  -DASH diet 2. Acute on chronic left knee pain  -Cont Naprosyn prn  -ortho referral at some point  -RICE 3. Chronic LEE  -increased Lasix 40 mg BID  -elevation as able 4. Right hemithorax mass  -CT chest offered; he wishes to wait until insurance issue resolved  -CT Surgery consult when imaging completed 5. Anemia  -check iron panel  -FeSO4 script  Appt with financial counselor  Return in about 2 weeks (around 03/27/2016).  The patient was given clear instructions to go to ER or return to medical center if symptoms don't improve, worsen or new problems develop. The patient verbalized understanding. The patient was told to call to get lab results if they haven't heard anything in the next week.   This note has been created with Education officer, environmentalDragon speech recognition software and smart phrase technology. Any transcriptional errors are unintentional.    Scot Juniffany Tyrees Chopin, PA-C [facility] [place] [phone]   03/13/2016, 11:01 AM

## 2016-03-13 NOTE — Progress Notes (Signed)
Pt is here today for knee pain. Pt also states that he would like to talk about increasing his Lasix due to pt still having swelling in his legs. Pt is also having trouble breathing while doing certain activities such as walking up the stairs.

## 2016-03-14 LAB — FERRITIN: FERRITIN: 320 ng/mL (ref 20–380)

## 2016-03-28 MED FILL — NAPROXEN 500 MG TABLET: 500 | 15 days supply | Qty: 30 | Fill #1

## 2016-03-29 ENCOUNTER — Encounter: Payer: Self-pay | Admitting: Family Medicine

## 2016-03-29 ENCOUNTER — Ambulatory Visit: Payer: Self-pay | Attending: Family Medicine | Admitting: Family Medicine

## 2016-03-29 VITALS — BP 147/77 | HR 77 | Temp 98.1°F | Ht 68.0 in | Wt 353.4 lb

## 2016-03-29 DIAGNOSIS — Z6841 Body Mass Index (BMI) 40.0 and over, adult: Secondary | ICD-10-CM | POA: Insufficient documentation

## 2016-03-29 DIAGNOSIS — D649 Anemia, unspecified: Secondary | ICD-10-CM | POA: Insufficient documentation

## 2016-03-29 DIAGNOSIS — R7309 Other abnormal glucose: Secondary | ICD-10-CM

## 2016-03-29 DIAGNOSIS — M25562 Pain in left knee: Secondary | ICD-10-CM | POA: Insufficient documentation

## 2016-03-29 DIAGNOSIS — I10 Essential (primary) hypertension: Secondary | ICD-10-CM | POA: Insufficient documentation

## 2016-03-29 DIAGNOSIS — G8929 Other chronic pain: Secondary | ICD-10-CM | POA: Insufficient documentation

## 2016-03-29 DIAGNOSIS — R7303 Prediabetes: Secondary | ICD-10-CM | POA: Insufficient documentation

## 2016-03-29 LAB — POCT GLYCOSYLATED HEMOGLOBIN (HGB A1C): Hemoglobin A1C: 5.7

## 2016-03-29 MED ORDER — TURMERIC 500 MG PO CAPS: 500.0000 mg | ORAL_CAPSULE | Freq: Every day | ORAL | Status: AC

## 2016-03-29 MED ORDER — GLUCOSAMINE-CHONDROITIN 750-600 MG PO TABS: 2.0000 | ORAL_TABLET | Freq: Every day | ORAL | 0 refills | Status: AC

## 2016-03-29 MED ORDER — HYDRALAZINE HCL 100 MG PO TABS: 100.0000 mg | ORAL_TABLET | Freq: Three times a day (TID) | ORAL | 5 refills | Status: AC

## 2016-03-29 MED ORDER — TRUEPLUS LANCETS 28G MISC: 1.0000 | Freq: Three times a day (TID) | 3 refills | Status: AC

## 2016-03-29 MED ORDER — GLUCOSE BLOOD VI STRP: 1.0000 | ORAL_STRIP | Freq: Every day | 11 refills | Status: AC

## 2016-03-29 MED ORDER — SPIRONOLACTONE 25 MG PO TABS: 25.0000 mg | ORAL_TABLET | Freq: Two times a day (BID) | ORAL | 5 refills | Status: AC

## 2016-03-29 MED ORDER — METHYLPREDNISOLONE ACETATE 40 MG/ML IJ SUSP
40.0000 mg | Freq: Once | INTRAMUSCULAR | Status: AC
  Administered 2016-03-29: 40 mg via INTRAMUSCULAR

## 2016-03-29 MED ORDER — FUROSEMIDE 20 MG PO TABS: 40.0000 mg | ORAL_TABLET | Freq: Two times a day (BID) | ORAL | 5 refills | Status: DC

## 2016-03-29 MED ORDER — FERROUS SULFATE 325 (65 FE) MG PO TABS: 325.0000 mg | ORAL_TABLET | Freq: Two times a day (BID) | ORAL | 3 refills | Status: DC

## 2016-03-29 MED ORDER — TRUE METRIX METER W/DEVICE KIT: 1.0000 | PACK | 0 refills | Status: AC | PRN

## 2016-03-29 MED ORDER — METOPROLOL SUCCINATE ER 50 MG PO TB24: 50.0000 mg | ORAL_TABLET | Freq: Every day | ORAL | 5 refills | Status: AC

## 2016-03-29 MED FILL — FUROSEMIDE 20 MG TABLET: 20 | 30 days supply | Qty: 120 | Fill #0

## 2016-03-29 MED FILL — FERROUS SULFATE 325 MG TAB: 325 (65 FE) | 15 days supply | Qty: 30 | Fill #0

## 2016-03-29 MED FILL — TRUEplus LANCETS 28G MISC: 30 days supply | Qty: 100 | Fill #0

## 2016-03-29 MED FILL — TRUE METRIX TEST STRIP: 30 days supply | Qty: 100 | Fill #0

## 2016-03-29 MED FILL — TRUE METRIX BLOOD GLUCOSE M: W/DEVICE | 365 days supply | Qty: 1 | Fill #0

## 2016-03-29 MED FILL — METOPROLOL SUCC ER 50 MG TA: 50 | 30 days supply | Qty: 30 | Fill #0

## 2016-03-29 MED FILL — SPIRONOLACTONE 25 MG TABLET: 25 | 30 days supply | Qty: 60 | Fill #0

## 2016-03-29 NOTE — Progress Notes (Signed)
Pt is here to follow up on knee pain and HTN.  Pt needs refill on atenolol, and naproxen.

## 2016-03-29 NOTE — Assessment & Plan Note (Signed)
Very mild suspect dilutional  No GI bleeding Taking iron

## 2016-03-29 NOTE — Assessment & Plan Note (Signed)
Chronic pain in setting of obesity  Steroid shot done today Recommend tumeric and glucosamine-chondroitin Use naproxen sparingly

## 2016-03-29 NOTE — Assessment & Plan Note (Signed)
Obesity Advised  Increase exercise  Reduce intake of sugar

## 2016-03-29 NOTE — Assessment & Plan Note (Addendum)
Elevated with slight improvement Discussed reduce sugar intake in diet Increase exercise CBG monitoring

## 2016-03-29 NOTE — Progress Notes (Signed)
Subjective:  Patient ID: Douglas Camacho, male    DOB: 1970-04-15  Age: 46 y.o. MRN: [id number]  CC: Hypertension   HPI Douglas Camacho  Has morbid obesity, chronic HTN, presents for    1. CHRONIC HYPERTENSION since early 20s.   Disease Monitoring  Blood pressure range: not checking   Chest pain: no   Dyspnea: yes, but improved with addition of lasix at last OV    Claudication: no   Medication compliance: yes  Medication Side Effects  Lightheadedness: no   Urinary frequency: yes after lasix   Edema: yes, improving.    Preventitive Healthcare:  Exercise: no   2. Knee pain: since early 58s. 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 He has chronic pain in his R knee. Anterior. No swelling or redness. Worse with knee extension. Feels grinding and sensation of knee giving out at times. No falls.   3. Borderline diabetes: was informed of this some years ago. Reduced intake of sugar. No exercise. No CBG monitoring.   Social History  Substance Use Topics  . Smoking status: Never Smoker  . Smokeless tobacco: Never Used     Comment: trying to quit  . Alcohol use No     Comment: drinks 4-5 cans of beer daily    Outpatient Medications Prior to Visit  Medication Sig Dispense Refill  . atenolol (TENORMIN) 50 MG tablet Take 1 tablet (50 mg total) by mouth daily. 30 tablet 3  . furosemide (LASIX) 20 MG tablet Take 2 tablets (40 mg total) by mouth 2 (two) times daily. 60 tablet 3  . hydrALAZINE (APRESOLINE) 100 MG tablet Take 1 tablet (100 mg total) by mouth 2 (two) times daily. 60 tablet 3  . naproxen (NAPROSYN) 500 MG tablet Take 1 tablet (500 mg total) by mouth 2 (two) times daily with a meal. 30 tablet 3  . omeprazole (PRILOSEC) 20 MG capsule Take 1 capsule (20 mg total) by mouth daily. 30 capsule 3  . spironolactone (ALDACTONE) 25 MG tablet Take 1 tablet (25 mg total) by mouth 2 (two) times daily. 60 tablet 3  . ferrous sulfate 325 (65 FE) MG tablet Take 1 tablet (325 mg total) by  mouth 2 (two) times daily with a meal. (Patient not taking: Reported on 03/29/2016) 30 tablet 3   No facility-administered medications prior to visit.     ROS Review of Systems  Constitutional: Negative for chills, fatigue, fever and unexpected weight change.  Eyes: Negative for visual disturbance.  Respiratory: Positive for shortness of breath. Negative for cough.   Cardiovascular: Positive for leg swelling. Negative for chest pain and palpitations.  Gastrointestinal: Negative for abdominal pain, blood in stool, constipation, diarrhea, nausea and vomiting.  Endocrine: Negative for polydipsia, polyphagia and polyuria.  Musculoskeletal: Positive for arthralgias. Negative for back pain, gait problem, myalgias and neck pain.  Skin: Negative for rash.  Allergic/Immunologic: Negative for immunocompromised state.  Hematological: Negative for adenopathy. Does not bruise/bleed easily.  Psychiatric/Behavioral: Negative for dysphoric mood, sleep disturbance and suicidal ideas. The patient is not nervous/anxious.     Objective:  BP (!) 147/77 (BP Location: Left Arm, Patient Position: Sitting, Cuff Size: Large)   Pulse 77   Temp 98.1 F (36.7 C) (Oral)   Ht 5' 8"  (1.727 m)   Wt (!) 353 lb 6.4 oz (160.3 kg)   SpO2 97%   BMI 53.73 kg/m   BP/Weight 03/29/2016 03/13/2016 15/09/2618  Systolic BP 355 974 163  Diastolic BP 77 81 67  Wt. (Lbs) 353.4 359.8 -  BMI 53.73 54.71 -  Physical Exam  Constitutional: He appears well-developed and well-nourished. No distress.  Obese   HENT:  Head: Normocephalic and atraumatic.  Neck: Normal range of motion. Neck supple.  Cardiovascular: Normal rate, regular rhythm, normal heart sounds and intact distal pulses.   Pulmonary/Chest: Effort normal and breath sounds normal.  Musculoskeletal: He exhibits edema (trace).       Left knee: He exhibits normal range of motion and no swelling. Tenderness found. Medial joint line and lateral joint line tenderness noted.    Neurological: He is alert.  Skin: Skin is warm and dry. No rash noted. No erythema.  Psychiatric: He has a normal mood and affect.     Chemistry      Component Value Date/Time   NA 142 03/13/2016 1002   K 4.5 03/13/2016 1002   CL 107 03/13/2016 1002   CO2 28 03/13/2016 1002   BUN 14 03/13/2016 1002   CREATININE 0.87 03/13/2016 1002      Component Value Date/Time   CALCIUM 8.8 03/13/2016 1002   ALKPHOS 52 02/28/2016 2005   AST 23 02/28/2016 2005   ALT 24 02/28/2016 2005   BILITOT 0.3 02/28/2016 2005      Lab Results  Component Value Date   HGBA1C 5.8 (H) 07/23/2011   Lab Results  Component Value Date   HGBA1C 5.7 03/29/2016   After obtaining informed consent and cleaning the skin using iodine and alcohol a  steroid injection was performed at L knee.  Using 1:1 , 1 ml of  1% plain Lidocaine and 40 mg/ml of Depo Medrol. This was well tolerated.    Assessment & Plan:  Douglas Camacho was seen today for hypertension.  Diagnoses and all orders for this visit:  Hypertension, unspecified type -     spironolactone (ALDACTONE) 25 MG tablet; Take 1 tablet (25 mg total) by mouth 2 (two) times daily. -     hydrALAZINE (APRESOLINE) 100 MG tablet; Take 1 tablet (100 mg total) by mouth 3 (three) times daily. -     furosemide (LASIX) 20 MG tablet; Take 2 tablets (40 mg total) by mouth 2 (two) times daily. -     metoprolol succinate (TOPROL-XL) 50 MG 24 hr tablet; Take 1 tablet (50 mg total) by mouth daily. Take with or immediately following a meal.  Elevated hemoglobin A1c -     POCT glycosylated hemoglobin (Hb A1C) -     Blood Glucose Monitoring Suppl (TRUE METRIX METER) w/Device KIT; 1 each by Does not apply route as needed. -     glucose blood (TRUE METRIX BLOOD GLUCOSE TEST) test strip; 1 each by Other route daily. -     TRUEPLUS LANCETS 28G MISC; 1 each by Does not apply route 3 (three) times daily.  Mild anemia -     ferrous sulfate 325 (65 FE) MG tablet; Take 1 tablet (325 mg total)  by mouth 2 (two) times daily with a meal.  Chronic pain of left knee -     methylPREDNISolone acetate (DEPO-MEDROL) injection 40 mg; Inject 1 mL (40 mg total) into the muscle once. -     Glucosamine-Chondroitin 750-600 MG TABS; Take 2 tablets by mouth daily. -     Turmeric 500 MG CAPS; Take 500 mg by mouth daily.  Other orders -     Cancel: Tdap vaccine greater than or equal to 7yo IM   There are no diagnoses linked to this encounter.  No  orders of the defined types were placed in this encounter.   Follow-up: Return in about 4 weeks (around 04/26/2016) for HTN .   Boykin Nearing MD

## 2016-03-29 NOTE — Patient Instructions (Addendum)
Douglas Camacho was seen today for hypertension.  Diagnoses and all orders for this visit:  Hypertension, unspecified type -     spironolactone (ALDACTONE) 25 MG tablet; Take 1 tablet (25 mg total) by mouth 2 (two) times daily. -     hydrALAZINE (APRESOLINE) 100 MG tablet; Take 1 tablet (100 mg total) by mouth 3 (three) times daily. -     furosemide (LASIX) 20 MG tablet; Take 2 tablets (40 mg total) by mouth 2 (two) times daily. -     metoprolol succinate (TOPROL-XL) 50 MG 24 hr tablet; Take 1 tablet (50 mg total) by mouth daily. Take with or immediately following a meal.  Elevated hemoglobin A1c -     POCT glycosylated hemoglobin (Hb A1C) -     Blood Glucose Monitoring Suppl (TRUE METRIX METER) w/Device KIT; 1 each by Does not apply route as needed. -     glucose blood (TRUE METRIX BLOOD GLUCOSE TEST) test strip; 1 each by Other route daily. -     TRUEPLUS LANCETS 28G MISC; 1 each by Does not apply route 3 (three) times daily.  Mild anemia -     ferrous sulfate 325 (65 FE) MG tablet; Take 1 tablet (325 mg total) by mouth 2 (two) times daily with a meal.  Chronic pain of left knee -     methylPREDNISolone acetate (DEPO-MEDROL) injection 40 mg; Inject 1 mL (40 mg total) into the muscle once. -     Glucosamine-Chondroitin 750-600 MG TABS; Take 2 tablets by mouth daily. -     Turmeric 500 MG CAPS; Take 500 mg by mouth daily.  Other orders -     Cancel: Tdap vaccine greater than or equal to 7yo IM   Diabetes blood sugar goals  Fasting (in AM before breakfast, 8 hrs of no eating or drinking (except water or unsweetened coffee or tea): 90-110 2 hrs after meals: < 160,   No low sugars: nothing < 70   You have received a shot of steroid in your joint today. Rest and ice knee today. Regular activity tomorrow. Look out for redness, swelling, fever,severe pain in joint and call if you experience these symptoms.   Remember to increase hydralazine to 3 times daily Take naproxen sparingly, only when  needed for short period 3-5 days  F/u in 4 weeks for HTN  Dr. Adrian Blackwater   Iron-Rich Diet Introduction Iron is a mineral that helps your body to produce hemoglobin. Hemoglobin is a protein in your red blood cells that carries oxygen to your body's tissues. Eating too little iron may cause you to feel weak and tired, and it can increase your risk for infection. Eating enough iron is necessary for your body's metabolism, muscle function, and nervous system. Iron is naturally found in many foods. It can also be added to foods or fortified in foods. There are two types of dietary iron:  Heme iron. Heme iron is absorbed by the body more easily than nonheme iron. Heme iron is found in meat, poultry, and fish.  Nonheme iron. Nonheme iron is found in dietary supplements, iron-fortified grains, beans, and vegetables. You may need to follow an iron-rich diet if:  You have been diagnosed with iron deficiency or iron-deficiency anemia.  You have a condition that prevents you from absorbing dietary iron, such as:  Infection in your intestines.  Celiac disease. This involves long-lasting (chronic) inflammation of your intestines.  You do not eat enough iron.  You eat a diet that is high  in foods that impair iron absorption.  You have lost a lot of blood.  You have heavy bleeding during your menstrual cycle.  You are pregnant. What is my plan? Your health care provider may help you to determine how much iron you need per day based on your condition. Generally, when a person consumes sufficient amounts of iron in the diet, the following iron needs are met:  Men.  78-62 years old: 11 mg per day.  32-51 years old: 8 mg per day.  Women.  59-64 years old: 15 mg per day.  21-43 years old: 18 mg per day.  Over 66 years old: 8 mg per day.  Pregnant women: 27 mg per day.  Breastfeeding women: 9 mg per day. What do I need to know about an iron-rich diet?  Eat fresh fruits and vegetables  that are high in vitamin C along with foods that are high in iron. This will help increase the amount of iron that your body absorbs from food, especially with foods containing nonheme iron. Foods that are high in vitamin C include oranges, peppers, tomatoes, and mango.  Take iron supplements only as directed by your health care provider. Overdose of iron can be life-threatening. If you were prescribed iron supplements, take them with orange juice or a vitamin C supplement.  Cook foods in pots and pans that are made from iron.  Eat nonheme iron-containing foods alongside foods that are high in heme iron. This helps to improve your iron absorption.  Certain foods and drinks contain compounds that impair iron absorption. Avoid eating these foods in the same meal as iron-rich foods or with iron supplements. These include:  Coffee, black tea, and red wine.  Milk, dairy products, and foods that are high in calcium.  Beans, soybeans, and peas.  Whole grains.  When eating foods that contain both nonheme iron and compounds that impair iron absorption, follow these tips to absorb iron better.  Soak beans overnight before cooking.  Soak whole grains overnight and drain them before using.  Ferment flours before baking, such as using yeast in bread dough. What foods can I eat? Grains  Iron-fortified breakfast cereal. Iron-fortified whole-wheat bread. Enriched rice. Sprouted grains. Vegetables  Spinach. Potatoes with skin. Green peas. Broccoli. Red and green bell peppers. Fermented vegetables. Fruits  Prunes. Raisins. Oranges. Strawberries. Mango. Grapefruit. Meats and Other Protein Sources  Beef liver. Oysters. Beef. Shrimp. [place]. Chicken. [place]. Sardines. Chickpeas. Nuts. Tofu. Beverages  Tomato juice. Fresh orange juice. Prune juice. Hibiscus tea. Fortified instant breakfast shakes. Condiments  Tahini. Fermented soy sauce. Sweets and Desserts  Black-strap molasses. Other  Wheat  germ. The items listed above may not be a complete list of recommended foods or beverages. Contact your dietitian for more options.  What foods are not recommended? Grains  Whole grains. Bran cereal. Bran flour. Oats. Vegetables  Artichokes. Brussels sprouts. Kale. Fruits  Blueberries. Raspberries. Strawberries. Figs. Meats and Other Protein Sources  Soybeans. Products made from soy protein. Dairy  Milk. Cream. Cheese. Yogurt. Cottage cheese. Beverages  Coffee. Black tea. Red wine. Sweets and Desserts  Cocoa. Chocolate. Ice cream. Other  Basil. Oregano. Parsley. The items listed above may not be a complete list of foods and beverages to avoid. Contact your dietitian for more information.  This information is not intended to replace advice given to you by your health care provider. Make sure you discuss any questions you have with your health care provider. Document Released: 10/23/2004 Document Revised: 09/29/2015 Document  Reviewed: 10/06/2013  2017 Elsevier   Low-Sodium Eating Plan Sodium raises blood pressure and causes water to be held in the body. Getting less sodium from food will help lower your blood pressure, reduce any swelling, and protect your heart, liver, and kidneys. We get sodium by adding salt (sodium chloride) to food. Most of our sodium comes from canned, boxed, and frozen foods. Restaurant foods, fast foods, and pizza are also very high in sodium. Even if you take medicine to lower your blood pressure or to reduce fluid in your body, getting less sodium from your food is important. What is my plan? Most people should limit their sodium intake to 2,300 mg a day. Your health care provider recommends that you limit your sodium intake to ___2000 mg_______ a day. What do I need to know about this eating plan? For the low-sodium eating plan, you will follow these general guidelines:  Choose foods with a % Daily Value for sodium of less than 5% (as listed on the food  label).  Use salt-free seasonings or herbs instead of table salt or sea salt.  Check with your health care provider or pharmacist before using salt substitutes.  Eat fresh foods.  Eat more vegetables and fruits.  Limit canned vegetables. If you do use them, rinse them well to decrease the sodium.  Limit cheese to 1 oz (28 g) per day.  Eat lower-sodium products, often labeled as "lower sodium" or "no salt added."  Avoid foods that contain monosodium glutamate (MSG). MSG is sometimes added to [place] food and some canned foods.  Check food labels (Nutrition Facts labels) on foods to learn how much sodium is in one serving.  Eat more home-cooked food and less restaurant, buffet, and fast food.  When eating at a restaurant, ask that your food be prepared with less salt, or no salt if possible. How do I read food labels for sodium information? The Nutrition Facts label lists the amount of sodium in one serving of the food. If you eat more than one serving, you must multiply the listed amount of sodium by the number of servings. Food labels may also identify foods as:  Sodium free-Less than 5 mg in a serving.  Very low sodium-35 mg or less in a serving.  Low sodium-140 mg or less in a serving.  Light in sodium-50% less sodium in a serving. For example, if a food that usually has 300 mg of sodium is changed to become light in sodium, it will have 150 mg of sodium.  Reduced sodium-25% less sodium in a serving. For example, if a food that usually has 400 mg of sodium is changed to reduced sodium, it will have 300 mg of sodium. What foods can I eat? Grains  Low-sodium cereals, including oats, puffed wheat and rice, and shredded wheat cereals. Low-sodium crackers. Unsalted rice and pasta. Lower-sodium bread. Vegetables  Frozen or fresh vegetables. Low-sodium or reduced-sodium canned vegetables. Low-sodium or reduced-sodium tomato sauce and paste. Low-sodium or reduced-sodium tomato and  vegetable juices. Fruits  Fresh, frozen, and canned fruit. Fruit juice. Meat and Other Protein Products  Low-sodium canned tuna and salmon. Fresh or frozen meat, poultry, seafood, and fish. Lamb. Unsalted nuts. Dried beans, peas, and lentils without added salt. Unsalted canned beans. Homemade soups without salt. Eggs. Dairy  Milk. Soy milk. Ricotta cheese. Low-sodium or reduced-sodium cheeses. Yogurt. Condiments  Fresh and dried herbs and spices. Salt-free seasonings. Onion and garlic powders. Low-sodium varieties of mustard and ketchup. Fresh or  refrigerated horseradish. Lemon juice. Fats and Oils  Reduced-sodium salad dressings. Unsalted butter. Other  Unsalted popcorn and pretzels. The items listed above may not be a complete list of recommended foods or beverages. Contact your dietitian for more options.  What foods are not recommended? Grains  Instant hot cereals. Bread stuffing, pancake, and biscuit mixes. Croutons. Seasoned rice or pasta mixes. Noodle soup cups. Boxed or frozen macaroni and cheese. Self-rising flour. Regular salted crackers. Vegetables  Regular canned vegetables. Regular canned tomato sauce and paste. Regular tomato and vegetable juices. Frozen vegetables in sauces. Salted [place] fries. Olives. Angie Fava. Relishes. Sauerkraut. Salsa. Meat and Other Protein Products  Salted, canned, smoked, spiced, or pickled meats, seafood, or fish. Bacon, ham, sausage, hot dogs, corned beef, chipped beef, and packaged luncheon meats. Salt pork. Jerky. Pickled herring. Anchovies, regular canned tuna, and sardines. Salted nuts. Dairy  Processed cheese and cheese spreads. Cheese curds. Blue cheese and cottage cheese. Buttermilk. Condiments  Onion and garlic salt, seasoned salt, table salt, and sea salt. Canned and packaged gravies. Worcestershire sauce. Tartar sauce. Barbecue sauce. Teriyaki sauce. Soy sauce, including reduced sodium. Steak sauce. Fish sauce. Oyster sauce. Cocktail sauce.  Horseradish that you find on the shelf. Regular ketchup and mustard. Meat flavorings and tenderizers. Bouillon cubes. Hot sauce. Tabasco sauce. Marinades. Taco seasonings. Relishes. Fats and Oils  Regular salad dressings. Salted butter. Margarine. Ghee. Bacon fat. Other  Potato and tortilla chips. Corn chips and puffs. Salted popcorn and pretzels. Canned or dried soups. Pizza. Frozen entrees and pot pies. The items listed above may not be a complete list of foods and beverages to avoid. Contact your dietitian for more information.  This information is not intended to replace advice given to you by your health care provider. Make sure you discuss any questions you have with your health care provider. Document Released: 08/31/2001 Document Revised: 08/17/2015 Document Reviewed: 01/13/2013 Elsevier Interactive Patient Education  2017 [employer].

## 2016-03-29 NOTE — Assessment & Plan Note (Signed)
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

## 2016-04-05 ENCOUNTER — Ambulatory Visit: Payer: Self-pay | Attending: Internal Medicine

## 2016-04-05 MED FILL — hydrALAZINE HCL 100 MG TABS: 100 | 30 days supply | Qty: 60 | Fill #1

## 2016-04-11 MED FILL — ?OMEPRAZOLE DR 20 MG CAPSUL: 20 | 30 days supply | Qty: 30 | Fill #1

## 2016-04-16 ENCOUNTER — Ambulatory Visit: Payer: Self-pay | Attending: Family Medicine | Admitting: Family Medicine

## 2016-04-16 ENCOUNTER — Encounter: Payer: Self-pay | Admitting: Family Medicine

## 2016-04-16 VITALS — BP 136/76 | HR 82 | Temp 98.1°F | Ht 68.0 in | Wt 354.2 lb

## 2016-04-16 DIAGNOSIS — M25562 Pain in left knee: Secondary | ICD-10-CM | POA: Insufficient documentation

## 2016-04-16 DIAGNOSIS — G8929 Other chronic pain: Secondary | ICD-10-CM | POA: Insufficient documentation

## 2016-04-16 DIAGNOSIS — I10 Essential (primary) hypertension: Secondary | ICD-10-CM | POA: Insufficient documentation

## 2016-04-16 DIAGNOSIS — Z6841 Body Mass Index (BMI) 40.0 and over, adult: Secondary | ICD-10-CM | POA: Insufficient documentation

## 2016-04-16 DIAGNOSIS — Z79899 Other long term (current) drug therapy: Secondary | ICD-10-CM | POA: Insufficient documentation

## 2016-04-16 DIAGNOSIS — R7303 Prediabetes: Secondary | ICD-10-CM | POA: Insufficient documentation

## 2016-04-16 DIAGNOSIS — M25561 Pain in right knee: Secondary | ICD-10-CM | POA: Insufficient documentation

## 2016-04-16 MED ORDER — TRAMADOL HCL 50 MG PO TABS: 50.0000 mg | ORAL_TABLET | Freq: Three times a day (TID) | ORAL | 0 refills | Status: AC | PRN

## 2016-04-16 MED FILL — traMADol HCL 50 MG TABS: 50 | 10 days supply | Qty: 60 | Fill #0

## 2016-04-16 NOTE — Assessment & Plan Note (Signed)
Improved Tramadol for flare up Weight loss

## 2016-04-16 NOTE — Assessment & Plan Note (Signed)
At goal Continue regimen Stressed low salt Stressed weight loss

## 2016-04-16 NOTE — Patient Instructions (Addendum)
Douglas Camacho was seen today for hypertension.  Diagnoses and all orders for this visit:  Hypertension, unspecified type  Chronic pain of left knee -     traMADol (ULTRAM) 50 MG tablet; Take 1 tablet (50 mg total) by mouth every 8 (eight) hours as needed.   F.u in 3 months for HTN  Dr. Armen PickupFunches

## 2016-04-16 NOTE — Progress Notes (Signed)
Subjective:  Patient ID: Douglas Camacho, male    DOB: 1970-06-15  Age: 46 y.o. MRN: [id number]  CC: Hypertension   HPI Douglas Camacho  Has morbid obesity, chronic HTN, presents for    1. CHRONIC HYPERTENSION since early 20s.   Disease Monitoring  Blood pressure range: not checking   Chest pain: no   Dyspnea: no   Claudication: no   Medication compliance: yes  Medication Side Effects  Lightheadedness: no   Urinary frequency: yes after lasix   Edema: yes, improving.    Preventitive Healthcare:  Exercise: no   2. Knee pain: since early 51s. 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 He has chronic pain in his R knee. Anterior. No swelling or redness. Worse with knee extension. Feels grinding and sensation of knee giving out at times. No falls. Improved with steroid injection. Still some medial L knee pain.   3. Borderline diabetes: was informed of this some years ago. Reduced intake of sugar. No exercise. No CBG monitoring.   Social History  Substance Use Topics  . Smoking status: Never Smoker  . Smokeless tobacco: Never Used     Comment: trying to quit  . Alcohol use No     Comment: drinks 4-5 cans of beer daily    Outpatient Medications Prior to Visit  Medication Sig Dispense Refill  . Blood Glucose Monitoring Suppl (TRUE METRIX METER) w/Device KIT 1 each by Does not apply route as needed. 1 kit 0  . ferrous sulfate 325 (65 FE) MG tablet Take 1 tablet (325 mg total) by mouth 2 (two) times daily with a meal. 30 tablet 3  . furosemide (LASIX) 20 MG tablet Take 2 tablets (40 mg total) by mouth 2 (two) times daily. 120 tablet 5  . Glucosamine-Chondroitin 750-600 MG TABS Take 2 tablets by mouth daily.  0  . glucose blood (TRUE METRIX BLOOD GLUCOSE TEST) test strip 1 each by Other route daily. 35 each 11  . hydrALAZINE (APRESOLINE) 100 MG tablet Take 1 tablet (100 mg total) by mouth 3 (three) times daily. 90 tablet 5  . metoprolol succinate (TOPROL-XL) 50 MG 24 hr tablet Take 1  tablet (50 mg total) by mouth daily. Take with or immediately following a meal. 30 tablet 5  . naproxen (NAPROSYN) 500 MG tablet Take 1 tablet (500 mg total) by mouth 2 (two) times daily with a meal. 30 tablet 3  . omeprazole (PRILOSEC) 20 MG capsule Take 1 capsule (20 mg total) by mouth daily. 30 capsule 3  . spironolactone (ALDACTONE) 25 MG tablet Take 1 tablet (25 mg total) by mouth 2 (two) times daily. 60 tablet 5  . TRUEPLUS LANCETS 28G MISC 1 each by Does not apply route 3 (three) times daily. 100 each 3  . Turmeric 500 MG CAPS Take 500 mg by mouth daily.     No facility-administered medications prior to visit.     ROS Review of Systems  Constitutional: Negative for chills, fatigue, fever and unexpected weight change.  Eyes: Negative for visual disturbance.  Respiratory: Positive for shortness of breath. Negative for cough.   Cardiovascular: Positive for leg swelling. Negative for chest pain and palpitations.  Gastrointestinal: Negative for abdominal pain, blood in stool, constipation, diarrhea, nausea and vomiting.  Endocrine: Negative for polydipsia, polyphagia and polyuria.  Musculoskeletal: Positive for arthralgias. Negative for back pain, gait problem, myalgias and neck pain.  Skin: Negative for rash.  Allergic/Immunologic: Negative for immunocompromised state.  Hematological: Negative for adenopathy.  Does not bruise/bleed easily.  Psychiatric/Behavioral: Negative for dysphoric mood, sleep disturbance and suicidal ideas. The patient is not nervous/anxious.     Objective:  BP 136/76 (BP Location: Left Arm, Patient Position: Sitting, Cuff Size: Large)   Pulse 82   Temp 98.1 F (36.7 C) (Oral)   Ht 5' 8"  (1.727 m)   Wt (!) 354 lb 3.2 oz (160.7 kg)   SpO2 96%   BMI 53.86 kg/m   BP/Weight 04/16/2016 03/29/2016 15/37/9432  Systolic BP 761 470 929  Diastolic BP 76 77 81  Wt. (Lbs) 354.2 353.4 359.8  BMI 53.86 53.73 54.71  Physical Exam  Constitutional: He appears  well-developed and well-nourished. No distress.  Obese   HENT:  Head: Normocephalic and atraumatic.  Neck: Normal range of motion. Neck supple.  Cardiovascular: Normal rate, regular rhythm, normal heart sounds and intact distal pulses.   Pulmonary/Chest: Effort normal and breath sounds normal.  Musculoskeletal: He exhibits edema (trace).       Left knee: He exhibits normal range of motion and no swelling. Tenderness found. Medial joint line and lateral joint line tenderness noted.  Neurological: He is alert.  Skin: Skin is warm and dry. No rash noted. No erythema.  Psychiatric: He has a normal mood and affect.     Chemistry      Component Value Date/Time   NA 142 03/13/2016 1002   K 4.5 03/13/2016 1002   CL 107 03/13/2016 1002   CO2 28 03/13/2016 1002   BUN 14 03/13/2016 1002   CREATININE 0.87 03/13/2016 1002      Component Value Date/Time   CALCIUM 8.8 03/13/2016 1002   ALKPHOS 52 02/28/2016 2005   AST 23 02/28/2016 2005   ALT 24 02/28/2016 2005   BILITOT 0.3 02/28/2016 2005      Lab Results  Component Value Date   HGBA1C 5.7 03/29/2016     Assessment & Plan:  Douglas Camacho was seen today for hypertension.  Diagnoses and all orders for this visit:  Hypertension, unspecified type  Chronic pain of left knee -     traMADol (ULTRAM) 50 MG tablet; Take 1 tablet (50 mg total) by mouth every 8 (eight) hours as needed.   There are no diagnoses linked to this encounter.  No orders of the defined types were placed in this encounter.   Follow-up: Return in about 3 months (around 07/15/2016) for HTN .   Boykin Nearing MD

## 2016-04-23 MED FILL — FERROUS SULFATE 325 MG TAB: 325 (65 FE) | 15 days supply | Qty: 30 | Fill #1

## 2016-04-23 MED FILL — NAPROXEN 500 MG TABLET: 500 | 15 days supply | Qty: 30 | Fill #2

## 2016-04-24 ENCOUNTER — Encounter (HOSPITAL_COMMUNITY): Payer: Self-pay | Admitting: Emergency Medicine

## 2016-04-24 ENCOUNTER — Ambulatory Visit (HOSPITAL_COMMUNITY)
Admission: EM | Admit: 2016-04-24 | Discharge: 2016-04-24 | Disposition: A | Discharge status: Home / Self Care | Payer: Self-pay | Attending: Family Medicine | Admitting: Family Medicine

## 2016-04-24 DIAGNOSIS — R05 Cough: Secondary | ICD-10-CM

## 2016-04-24 DIAGNOSIS — R059 Cough, unspecified: Secondary | ICD-10-CM

## 2016-04-24 DIAGNOSIS — R69 Illness, unspecified: Secondary | ICD-10-CM

## 2016-04-24 DIAGNOSIS — J111 Influenza due to unidentified influenza virus with other respiratory manifestations: Secondary | ICD-10-CM

## 2016-04-24 MED ORDER — OSELTAMIVIR PHOSPHATE 75 MG PO CAPS: 75.0000 mg | ORAL_CAPSULE | Freq: Two times a day (BID) | ORAL | 0 refills | Status: DC

## 2016-04-24 MED ORDER — BENZONATATE 100 MG PO CAPS: 100.0000 mg | ORAL_CAPSULE | Freq: Three times a day (TID) | ORAL | 0 refills | Status: DC

## 2016-04-24 MED ORDER — IPRATROPIUM BROMIDE 0.06 % NA SOLN: 2.0000 | Freq: Four times a day (QID) | NASAL | 0 refills | Status: DC

## 2016-04-24 MED FILL — IPRATROPIUM 0.06% SPRAY: 0.06 | 10 days supply | Qty: 15 | Fill #0

## 2016-04-24 MED FILL — OSELTAMIVIR PHOS 75 MG CAP: 75 | 5 days supply | Qty: 10 | Fill #0

## 2016-04-24 MED FILL — BENZONATATE 100 MG CAPSULE: 100 | 7 days supply | Qty: 21 | Fill #0

## 2016-04-24 NOTE — ED Provider Notes (Signed)
CSN: [id number]     Arrival date & time 04/24/16  1000 History   None    Chief Complaint  Patient presents with  . Generalized Body Aches  . Nasal Congestion   (Consider location/radiation/quality/duration/timing/severity/associated sxs/prior Treatment) Patient c/o nasal congestion, fever, cough, and aches.     The history is provided by the patient.  URI  Presenting symptoms: congestion, cough, fatigue, fever and rhinorrhea   Severity:  Moderate Onset quality:  Sudden Duration:  2 days Timing:  Constant Progression:  Worsening Chronicity:  New Relieved by:  Nothing   Past Medical History:  Diagnosis Date  . Bronchitis   . Chest pain   . GERD (gastroesophageal reflux disease)   . Hypercholesterolemia   . Hypertension   . Obesity   . Pleural mass 2005   bx= benign in [place], [place]  . Pneumonia Nov. 2012  . Pre-diabetes   . Tobacco abuse    Past Surgical History:  Procedure Laterality Date  . APPENDECTOMY     Family History  Problem Relation Age of Onset  . Kidney failure Mother     ESRD on HD for last 23 years  . Diabetes Mother   . Hypertension Mother   . Hypertension Father   . Diabetes Father    Social History  Substance Use Topics  . Smoking status: Former Smoker    Packs/day: 0.30    Years: 11.00    Types: Cigarettes    Quit date: 04/24/2010  . Smokeless tobacco: Never Used     Comment: trying to quit  . Alcohol use No     Comment: drinks 4-5 cans of beer daily    Review of Systems  Constitutional: Positive for fatigue and fever.  HENT: Positive for congestion and rhinorrhea.   Eyes: Negative.   Respiratory: Positive for cough.   Cardiovascular: Negative.   Gastrointestinal: Negative.   Endocrine: Negative.   Genitourinary: Negative.   Musculoskeletal: Negative.   Allergic/Immunologic: Negative.   Neurological: Negative.   Hematological: Negative.   Psychiatric/Behavioral: Negative.     Allergies  Patient has no known  allergies.  Home Medications   Prior to Admission medications   Medication Sig Start Date End Date Taking? Authorizing Provider  Blood Glucose Monitoring Suppl (TRUE METRIX METER) w/Device KIT 1 each by Does not apply route as needed. 03/29/16  Yes Josalyn Funches, MD  ferrous sulfate 325 (65 FE) MG tablet Take 1 tablet (325 mg total) by mouth 2 (two) times daily with a meal. 03/29/16  Yes Josalyn Funches, MD  furosemide (LASIX) 20 MG tablet Take 2 tablets (40 mg total) by mouth 2 (two) times daily. 03/29/16  Yes Josalyn Funches, MD  Glucosamine-Chondroitin 750-600 MG TABS Take 2 tablets by mouth daily. 03/29/16  Yes Josalyn Funches, MD  glucose blood (TRUE METRIX BLOOD GLUCOSE TEST) test strip 1 each by Other route daily. 03/29/16  Yes Josalyn Funches, MD  hydrALAZINE (APRESOLINE) 100 MG tablet Take 1 tablet (100 mg total) by mouth 3 (three) times daily. 03/29/16  Yes Josalyn Funches, MD  metoprolol succinate (TOPROL-XL) 50 MG 24 hr tablet Take 1 tablet (50 mg total) by mouth daily. Take with or immediately following a meal. 03/29/16  Yes Josalyn Funches, MD  naproxen (NAPROSYN) 500 MG tablet Take 1 tablet (500 mg total) by mouth 2 (two) times daily with a meal. 03/13/16  Yes Tiffany S Noel, PA-C  omeprazole (PRILOSEC) 20 MG capsule Take 1 capsule (20 mg total) by mouth daily. 03/13/16  Yes  Brayton Caves, PA-C  spironolactone (ALDACTONE) 25 MG tablet Take 1 tablet (25 mg total) by mouth 2 (two) times daily. 03/29/16  Yes Josalyn Funches, MD  traMADol (ULTRAM) 50 MG tablet Take 1 tablet (50 mg total) by mouth every 8 (eight) hours as needed. 04/16/16  Yes Josalyn Funches, MD  TRUEPLUS LANCETS 28G MISC 1 each by Does not apply route 3 (three) times daily. 03/29/16  Yes Josalyn Funches, MD  Turmeric 500 MG CAPS Take 500 mg by mouth daily. 03/29/16  Yes Josalyn Funches, MD  benzonatate (TESSALON) 100 MG capsule Take 1 capsule (100 mg total) by mouth every 8 (eight) hours. 04/24/16   Lysbeth Penner, FNP  ipratropium  (ATROVENT) 0.06 % nasal spray Place 2 sprays into both nostrils 4 (four) times daily. 04/24/16   Lysbeth Penner, FNP  oseltamivir (TAMIFLU) 75 MG capsule Take 1 capsule (75 mg total) by mouth every 12 (twelve) hours. 04/24/16   Lysbeth Penner, FNP   Meds Ordered and Administered this Visit  Medications - No data to display  BP 163/80 (BP Location: Left Arm)   Pulse (!) 59   Temp 97.1 F (36.2 C) (Oral)   SpO2 100%  No data found.   Physical Exam  Constitutional: He appears well-developed and well-nourished.  HENT:  Head: Normocephalic and atraumatic.  Right Ear: External ear normal.  Left Ear: External ear normal.  Mouth/Throat: Oropharynx is clear and moist.  Eyes: Conjunctivae are normal. Pupils are equal, round, and reactive to light.  Neck: Normal range of motion. Neck supple.  Cardiovascular: Normal rate, regular rhythm and normal heart sounds.   Pulmonary/Chest: Effort normal and breath sounds normal.  Nursing note and vitals reviewed.   Urgent Care Course     Procedures (including critical care time)  Labs Review Labs Reviewed - No data to display  Imaging Review No results found.   Visual Acuity Review  Right Eye Distance:   Left Eye Distance:   Bilateral Distance:    Right Eye Near:   Left Eye Near:    Bilateral Near:         MDM   1. Influenza-like illness   2. Cough    Tamiflu Atrovent nasal spray Tessalon  Push po fluids, rest, tylenol and motrin otc prn as directed for fever, arthralgias, and myalgias.  Follow up prn if sx's continue or persist.   Lysbeth Penner, FNP 04/24/16 1106

## 2016-04-24 NOTE — ED Triage Notes (Signed)
Pt has been suffering from nasal congestion & drainage, generalized body aches, SOB, and chills since Monday night.  No recorded fever at home.  Pt taking Dayquil with little relief.

## 2016-04-25 ENCOUNTER — Encounter (HOSPITAL_COMMUNITY): Payer: Self-pay | Admitting: Emergency Medicine

## 2016-04-25 ENCOUNTER — Ambulatory Visit (HOSPITAL_COMMUNITY)
Admission: EM | Admit: 2016-04-25 | Discharge: 2016-04-25 | Disposition: A | Discharge status: Home / Self Care | Payer: Self-pay | Attending: Family Medicine | Admitting: Family Medicine

## 2016-04-25 DIAGNOSIS — R69 Illness, unspecified: Secondary | ICD-10-CM

## 2016-04-25 DIAGNOSIS — J111 Influenza due to unidentified influenza virus with other respiratory manifestations: Secondary | ICD-10-CM

## 2016-04-25 NOTE — Discharge Instructions (Signed)
Continue your medicines that were started yesterday. I have written a work note for you. I would advise rest, drink plenty of fluids and take your medicines. Should your symptoms fail to improve within a week consider following up with your primary care provider or return to clinic

## 2016-04-25 NOTE — ED Triage Notes (Signed)
Pt here for persistent cold sx  Seen here yest w/similar sx... Given tamiflu and cough caps w/some relief.   Needing note for work.    A&O x4... NAD

## 2016-04-25 NOTE — ED Provider Notes (Signed)
CSN: [id number]     Arrival date & time 04/25/16  1925 History   First MD Initiated Contact with Patient 04/25/16 1947     Chief Complaint  Patient presents with  . URI   (Consider location/radiation/quality/duration/timing/severity/associated sxs/prior Treatment) 46 year old male presents to clinic with flu like symptoms. Was seen in clinic yesterday for similar symptoms, diagnosed with the flu, started on tamiflu, nasal spray, and tessalon for cough. He has had no change in his symptoms but requests a work note.   The history is provided by the patient.    Past Medical History:  Diagnosis Date  . Bronchitis   . Chest pain   . GERD (gastroesophageal reflux disease)   . Hypercholesterolemia   . Hypertension   . Obesity   . Pleural mass 2005   bx= benign in [place], [place]  . Pneumonia Nov. 2012  . Pre-diabetes   . Tobacco abuse    Past Surgical History:  Procedure Laterality Date  . APPENDECTOMY     Family History  Problem Relation Age of Onset  . Kidney failure Mother     ESRD on HD for last 23 years  . Diabetes Mother   . Hypertension Mother   . Hypertension Father   . Diabetes Father    Social History  Substance Use Topics  . Smoking status: Former Smoker    Packs/day: 0.30    Years: 11.00    Types: Cigarettes    Quit date: 04/24/2010  . Smokeless tobacco: Never Used     Comment: trying to quit  . Alcohol use No     Comment: drinks 4-5 cans of beer daily    Review of Systems  Constitutional: Positive for chills, fatigue and fever.  HENT: Positive for congestion and rhinorrhea. Negative for sinus pain and sinus pressure.   Respiratory: Positive for cough.   Cardiovascular: Negative for chest pain, palpitations and leg swelling.  Gastrointestinal: Positive for nausea. Negative for diarrhea and vomiting.  Musculoskeletal: Positive for myalgias.  Skin: Negative.     Allergies  Patient has no known allergies.  Home Medications   Prior to Admission  medications   Medication Sig Start Date End Date Taking? Authorizing Provider  benzonatate (TESSALON) 100 MG capsule Take 1 capsule (100 mg total) by mouth every 8 (eight) hours. 04/24/16  Yes Lysbeth Penner, FNP  ferrous sulfate 325 (65 FE) MG tablet Take 1 tablet (325 mg total) by mouth 2 (two) times daily with a meal. 03/29/16  Yes Josalyn Funches, MD  furosemide (LASIX) 20 MG tablet Take 2 tablets (40 mg total) by mouth 2 (two) times daily. 03/29/16  Yes Josalyn Funches, MD  Glucosamine-Chondroitin 750-600 MG TABS Take 2 tablets by mouth daily. 03/29/16  Yes Josalyn Funches, MD  glucose blood (TRUE METRIX BLOOD GLUCOSE TEST) test strip 1 each by Other route daily. 03/29/16  Yes Josalyn Funches, MD  hydrALAZINE (APRESOLINE) 100 MG tablet Take 1 tablet (100 mg total) by mouth 3 (three) times daily. 03/29/16  Yes Josalyn Funches, MD  ipratropium (ATROVENT) 0.06 % nasal spray Place 2 sprays into both nostrils 4 (four) times daily. 04/24/16  Yes Lysbeth Penner, FNP  metoprolol succinate (TOPROL-XL) 50 MG 24 hr tablet Take 1 tablet (50 mg total) by mouth daily. Take with or immediately following a meal. 03/29/16  Yes Josalyn Funches, MD  naproxen (NAPROSYN) 500 MG tablet Take 1 tablet (500 mg total) by mouth 2 (two) times daily with a meal. 03/13/16  Yes  Brayton Caves, PA-C  omeprazole (PRILOSEC) 20 MG capsule Take 1 capsule (20 mg total) by mouth daily. 03/13/16  Yes Tiffany Daneil Dan, PA-C  oseltamivir (TAMIFLU) 75 MG capsule Take 1 capsule (75 mg total) by mouth every 12 (twelve) hours. 04/24/16  Yes Lysbeth Penner, FNP  spironolactone (ALDACTONE) 25 MG tablet Take 1 tablet (25 mg total) by mouth 2 (two) times daily. 03/29/16  Yes Josalyn Funches, MD  traMADol (ULTRAM) 50 MG tablet Take 1 tablet (50 mg total) by mouth every 8 (eight) hours as needed. 04/16/16  Yes Josalyn Funches, MD  TRUEPLUS LANCETS 28G MISC 1 each by Does not apply route 3 (three) times daily. 03/29/16  Yes Josalyn Funches, MD  Turmeric 500 MG  CAPS Take 500 mg by mouth daily. 03/29/16  Yes Josalyn Funches, MD  Blood Glucose Monitoring Suppl (TRUE METRIX METER) w/Device KIT 1 each by Does not apply route as needed. 03/29/16   Boykin Nearing, MD   Meds Ordered and Administered this Visit  Medications - No data to display  BP 129/76 (BP Location: Right Arm)   Pulse 72   Temp 98.1 F (36.7 C) (Oral)   Resp 18   SpO2 97%  No data found.   Physical Exam  Constitutional: He is oriented to person, place, and time. He appears well-developed and well-nourished. He has a sickly appearance. He appears ill. No distress.  HENT:  Head: Normocephalic and atraumatic.  Right Ear: Tympanic membrane and external ear normal.  Left Ear: Tympanic membrane and external ear normal.  Nose: Rhinorrhea present. Right sinus exhibits no maxillary sinus tenderness and no frontal sinus tenderness. Left sinus exhibits no maxillary sinus tenderness and no frontal sinus tenderness.  Mouth/Throat: Uvula is midline and oropharynx is clear and moist. No oropharyngeal exudate.  Eyes: Pupils are equal, round, and reactive to light.  Neck: Normal range of motion. Neck supple. No JVD present.  Cardiovascular: Normal rate and regular rhythm.   Pulmonary/Chest: Effort normal and breath sounds normal. No respiratory distress. He has no wheezes. He has no rhonchi.  Abdominal: Soft. Bowel sounds are normal.  Lymphadenopathy:       Head (right side): No submandibular and no tonsillar adenopathy present.       Head (left side): No submandibular and no tonsillar adenopathy present.    He has no cervical adenopathy.  Neurological: He is alert and oriented to person, place, and time.  Skin: Skin is warm and dry. Capillary refill takes less than 2 seconds. No rash noted. He is not diaphoretic. No erythema.  Psychiatric: He has a normal mood and affect.  Nursing note and vitals reviewed.   Urgent Care Course     Procedures (including critical care time)  Labs  Review Labs Reviewed - No data to display  Imaging Review No results found.   Visual Acuity Review  Right Eye Distance:   Left Eye Distance:   Bilateral Distance:    Right Eye Near:   Left Eye Near:    Bilateral Near:         MDM   1. Influenza-like illness   Continue your medicines that were started yesterday. I have written a work note for you. I would advise rest, drink plenty of fluids and take your medicines. Should your symptoms fail to improve within a week consider following up with your primary care provider or return to clinic    Barnet Glasgow, NP 04/25/16 2005

## 2016-05-01 MED FILL — hydrALAZINE HCL 100 MG TABS: 100 | 30 days supply | Qty: 90 | Fill #0

## 2016-05-22 MED FILL — FUROSEMIDE 20 MG TABLET: 20 | 30 days supply | Qty: 120 | Fill #1

## 2016-05-22 MED FILL — NAPROXEN 500 MG TABLET: 500 | 15 days supply | Qty: 30 | Fill #3

## 2016-05-22 MED FILL — FERROUS SULFATE 325 MG TAB: 325 (65 FE) | 15 days supply | Qty: 30 | Fill #2

## 2016-05-29 MED FILL — METOPROLOL SUCC ER 50 MG TA: 50 | 30 days supply | Qty: 30 | Fill #1

## 2016-05-31 MED FILL — hydrALAZINE HCL 100 MG TABS: 100 | 30 days supply | Qty: 90 | Fill #1

## 2016-06-24 MED FILL — FUROSEMIDE 20 MG TABLET: 20 | 30 days supply | Qty: 120 | Fill #2

## 2016-06-25 MED FILL — METOPROLOL SUCC ER 50 MG TA: 50 | 30 days supply | Qty: 30 | Fill #2

## 2016-07-02 ENCOUNTER — Telehealth: Payer: Self-pay | Admitting: *Deleted

## 2016-07-02 ENCOUNTER — Emergency Department (HOSPITAL_COMMUNITY): Payer: Self-pay

## 2016-07-02 ENCOUNTER — Ambulatory Visit: Payer: Self-pay | Attending: Family Medicine

## 2016-07-02 ENCOUNTER — Emergency Department (HOSPITAL_COMMUNITY)
Admission: EM | Admit: 2016-07-02 | Discharge: 2016-07-02 | Disposition: A | Discharge status: Home / Self Care | Payer: Self-pay | Attending: Emergency Medicine | Admitting: Emergency Medicine

## 2016-07-02 ENCOUNTER — Ambulatory Visit: Payer: Self-pay

## 2016-07-02 ENCOUNTER — Encounter (HOSPITAL_COMMUNITY): Payer: Self-pay

## 2016-07-02 ENCOUNTER — Emergency Department (HOSPITAL_BASED_OUTPATIENT_CLINIC_OR_DEPARTMENT_OTHER)
Admit: 2016-07-02 | Discharge: 2016-07-02 | Disposition: A | Discharge status: Home / Self Care | Payer: Self-pay | Attending: Emergency Medicine | Admitting: Emergency Medicine

## 2016-07-02 DIAGNOSIS — Z87891 Personal history of nicotine dependence: Secondary | ICD-10-CM | POA: Insufficient documentation

## 2016-07-02 DIAGNOSIS — I10 Essential (primary) hypertension: Secondary | ICD-10-CM | POA: Insufficient documentation

## 2016-07-02 DIAGNOSIS — M79604 Pain in right leg: Secondary | ICD-10-CM | POA: Insufficient documentation

## 2016-07-02 DIAGNOSIS — M79609 Pain in unspecified limb: Secondary | ICD-10-CM

## 2016-07-02 DIAGNOSIS — Z79899 Other long term (current) drug therapy: Secondary | ICD-10-CM | POA: Insufficient documentation

## 2016-07-02 LAB — COMPREHENSIVE METABOLIC PANEL
ALBUMIN: 3.4 g/dL — AB (ref 3.5–5.0)
ALT: 17 U/L (ref 17–63)
AST: 19 U/L (ref 15–41)
Alkaline Phosphatase: 55 U/L (ref 38–126)
Anion gap: 11 (ref 5–15)
BUN: 12 mg/dL (ref 6–20)
CHLORIDE: 103 mmol/L (ref 101–111)
CO2: 26 mmol/L (ref 22–32)
Calcium: 8.6 mg/dL — ABNORMAL LOW (ref 8.9–10.3)
Creatinine, Ser: 1.02 mg/dL (ref 0.61–1.24)
GFR calc Af Amer: >60 mL/min (ref >60)
GFR calc non Af Amer: >60 mL/min (ref >60)
GLUCOSE: 96 mg/dL (ref 65–99)
POTASSIUM: 3.3 mmol/L — AB (ref 3.5–5.1)
SODIUM: 140 mmol/L (ref 135–145)
Total Bilirubin: 0.4 mg/dL (ref 0.3–1.2)
Total Protein: 6.6 g/dL (ref 6.5–8.1)

## 2016-07-02 LAB — CBC WITH DIFFERENTIAL/PLATELET
BASOS ABS: 0 10*3/uL (ref 0.0–0.1)
BASOS PCT: 0 %
EOS PCT: 4 %
Eosinophils Absolute: 0.3 10*3/uL (ref 0.0–0.7)
HCT: 36.2 % — ABNORMAL LOW (ref 39.0–52.0)
Hemoglobin: 11.7 g/dL — ABNORMAL LOW (ref 13.0–17.0)
Lymphocytes Relative: 22 %
Lymphs Abs: 1.8 10*3/uL (ref 0.7–4.0)
MCH: 28.3 pg (ref 26.0–34.0)
MCHC: 32.3 g/dL (ref 30.0–36.0)
MCV: 87.7 fL (ref 78.0–100.0)
MONO ABS: 0.7 10*3/uL (ref 0.1–1.0)
Monocytes Relative: 8 %
Neutro Abs: 5.3 10*3/uL (ref 1.7–7.7)
Neutrophils Relative %: 66 %
PLATELETS: 181 10*3/uL (ref 150–400)
RBC: 4.13 MIL/uL — AB (ref 4.22–5.81)
RDW: 13.4 % (ref 11.5–15.5)
WBC: 8.2 10*3/uL (ref 4.0–10.5)

## 2016-07-02 LAB — TROPONIN I: Troponin I: <0.03 ng/mL (ref <0.03)

## 2016-07-02 LAB — BRAIN NATRIURETIC PEPTIDE: B NATRIURETIC PEPTIDE 5: 15.5 pg/mL (ref 0.0–100.0)

## 2016-07-02 MED ORDER — POTASSIUM CHLORIDE CRYS ER 20 MEQ PO TBCR: 40.0000 meq | EXTENDED_RELEASE_TABLET | Freq: Two times a day (BID) | ORAL | 0 refills | Status: AC

## 2016-07-02 MED ORDER — OXYCODONE-ACETAMINOPHEN 5-325 MG PO TABS: 1.0000 | ORAL_TABLET | Freq: Three times a day (TID) | ORAL | 0 refills | Status: AC | PRN

## 2016-07-02 MED ORDER — FUROSEMIDE 10 MG/ML IJ SOLN
80.0000 mg | Freq: Once | INTRAMUSCULAR | Status: AC
  Administered 2016-07-02: 80 mg via INTRAVENOUS
  Filled 2016-07-02: qty 8

## 2016-07-02 MED ORDER — POTASSIUM CHLORIDE CRYS ER 20 MEQ PO TBCR
40.0000 meq | EXTENDED_RELEASE_TABLET | Freq: Once | ORAL | Status: AC
  Administered 2016-07-02: 40 meq via ORAL
  Filled 2016-07-02: qty 2

## 2016-07-02 MED ORDER — FUROSEMIDE 40 MG PO TABS: 80.0000 mg | ORAL_TABLET | Freq: Two times a day (BID) | ORAL | 0 refills | Status: DC

## 2016-07-02 MED ORDER — OXYCODONE-ACETAMINOPHEN 5-325 MG PO TABS
2.0000 | ORAL_TABLET | Freq: Once | ORAL | Status: AC
  Administered 2016-07-02: 2 via ORAL
  Filled 2016-07-02: qty 2

## 2016-07-02 MED ORDER — PREDNISONE 20 MG PO TABS: ORAL_TABLET | ORAL | 0 refills | Status: DC

## 2016-07-02 NOTE — Progress Notes (Signed)
*  PRELIMINARY RESULTS* Vascular Ultrasound Right lower extremity venous duplex has been completed.  Preliminary findings: No evidence of deep vein thrombosis in the visualized veins of the right lower extremity. Limited exam due to patient body habitus and penetration.  Preliminary results given to [place], RN   Chauncey Fischer 07/02/2016, 6:01 PM

## 2016-07-02 NOTE — ED Provider Notes (Signed)
[facility] DEPT Provider Note   CSN: [id number] Arrival date & time: 07/02/16  1644     History   Chief Complaint Chief Complaint  Patient presents with  . Leg Pain    HPI Douglas Camacho is a 46 y.o. male.   Leg Pain   This is a chronic problem. The current episode started more than 1 week ago. The problem occurs constantly. The problem has been gradually worsening. The pain is present in the left lower leg and right lower leg. The quality of the pain is described as aching. The pain is moderate. Pertinent negatives include no numbness and full range of motion. He has tried nothing for the symptoms. The treatment provided no relief.    Past Medical History:  Diagnosis Date  . Bronchitis   . Chest pain   . GERD (gastroesophageal reflux disease)   . Hypercholesterolemia   . Hypertension   . Obesity   . Pleural mass 2005   bx= benign in [place], [place]  . Pneumonia Nov. 2012  . Pre-diabetes   . Tobacco abuse     Patient Active Problem List   Diagnosis Date Noted  . Chronic pain of left knee 03/29/2016  . Elevated hemoglobin A1c 03/29/2016  . Mild anemia 03/29/2016  . Plantar fasciitis of left foot 08/09/2011  . 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 right Pleural mass 07/27/2011  . HTN (hypertension) 07/23/2011  . HLD (hyperlipidemia) 07/23/2011  . Obesity 07/23/2011    Past Surgical History:  Procedure Laterality Date  . APPENDECTOMY         Home Medications    Prior to Admission medications   Medication Sig Start Date End Date Taking? Authorizing Provider  benzonatate (TESSALON) 100 MG capsule Take 1 capsule (100 mg total) by mouth every 8 (eight) hours. 04/24/16   Lysbeth Penner, FNP  Blood Glucose Monitoring Suppl (TRUE METRIX METER) w/Device KIT 1 each by Does not apply route as needed. 03/29/16   Boykin Nearing, MD  ferrous sulfate 325 (65 FE) MG tablet Take 1 tablet (325 mg total) by mouth 2 (two) times daily with a meal. 03/29/16   Josalyn Funches, MD    furosemide (LASIX) 40 MG tablet Take 2 tablets (80 mg total) by mouth 2 (two) times daily. For three days then resume normal dosing 07/02/16 07/05/16  Merrily Pew, MD  Glucosamine-Chondroitin 750-600 MG TABS Take 2 tablets by mouth daily. 03/29/16   Josalyn Funches, MD  glucose blood (TRUE METRIX BLOOD GLUCOSE TEST) test strip 1 each by Other route daily. 03/29/16   Josalyn Funches, MD  hydrALAZINE (APRESOLINE) 100 MG tablet Take 1 tablet (100 mg total) by mouth 3 (three) times daily. 03/29/16   Josalyn Funches, MD  ipratropium (ATROVENT) 0.06 % nasal spray Place 2 sprays into both nostrils 4 (four) times daily. 04/24/16   Lysbeth Penner, FNP  metoprolol succinate (TOPROL-XL) 50 MG 24 hr tablet Take 1 tablet (50 mg total) by mouth daily. Take with or immediately following a meal. 03/29/16   Boykin Nearing, MD  naproxen (NAPROSYN) 500 MG tablet Take 1 tablet (500 mg total) by mouth 2 (two) times daily with a meal. 03/13/16   Brayton Caves, PA-C  omeprazole (PRILOSEC) 20 MG capsule Take 1 capsule (20 mg total) by mouth daily. 03/13/16   Brayton Caves, PA-C  oseltamivir (TAMIFLU) 75 MG capsule Take 1 capsule (75 mg total) by mouth every 12 (twelve) hours. 04/24/16   Lysbeth Penner, FNP  oxyCODONE-acetaminophen (PERCOCET) [id number]  MG tablet Take 1-2 tablets by mouth every 8 (eight) hours as needed for severe pain. 07/02/16   Merrily Pew, MD  potassium chloride SA (K-DUR,KLOR-CON) 20 MEQ tablet Take 2 tablets (40 mEq total) by mouth 2 (two) times daily. 07/02/16   Merrily Pew, MD  predniSONE (DELTASONE) 20 MG tablet 3 tabs po daily x 3 days, then 2 tabs x 3 days, then 1.5 tabs x 3 days, then 1 tab x 3 days, then 0.5 tabs x 3 days 07/02/16   Merrily Pew, MD  spironolactone (ALDACTONE) 25 MG tablet Take 1 tablet (25 mg total) by mouth 2 (two) times daily. 03/29/16   Josalyn Funches, MD  traMADol (ULTRAM) 50 MG tablet Take 1 tablet (50 mg total) by mouth every 8 (eight) hours as needed. 04/16/16   Boykin Nearing, MD   TRUEPLUS LANCETS 28G MISC 1 each by Does not apply route 3 (three) times daily. 03/29/16   Josalyn Funches, MD  Turmeric 500 MG CAPS Take 500 mg by mouth daily. 03/29/16   Boykin Nearing, MD    Family History Family History  Problem Relation Age of Onset  . Kidney failure Mother     ESRD on HD for last 23 years  . Diabetes Mother   . Hypertension Mother   . Hypertension Father   . Diabetes Father     Social History Social History  Substance Use Topics  . Smoking status: Former Smoker    Packs/day: 0.30    Years: 11.00    Types: Cigarettes    Quit date: 04/24/2010  . Smokeless tobacco: Never Used     Comment: trying to quit  . Alcohol use No     Comment: drinks 4-5 cans of beer daily     Allergies   Patient has no known allergies.   Review of Systems Review of Systems  Neurological: Negative for numbness.  All other systems reviewed and are negative.    Physical Exam Updated Vital Signs BP (!) 153/82   Pulse 80   Temp 98.4 F (36.9 C) (Oral)   Resp 18   Ht 5' 8"  (1.727 m)   Wt (!) 382 lb (173.3 kg)   SpO2 99%   BMI 58.08 kg/m   Physical Exam  Constitutional: He is oriented to person, place, and time. He appears well-developed and well-nourished.  HENT:  Head: Normocephalic and atraumatic.  Eyes: Conjunctivae and EOM are normal.  Neck: Normal range of motion.  Cardiovascular: Normal rate.   No murmur heard. Pulmonary/Chest: Effort normal. No respiratory distress.  Abdominal: Soft. He exhibits no distension.  Musculoskeletal: Normal range of motion. He exhibits edema (mild pitting edema). He exhibits no deformity.  Neurological: He is alert and oriented to person, place, and time. No cranial nerve deficit.  Skin: Skin is warm and dry. No rash noted. No erythema.  Nursing note and vitals reviewed.    ED Treatments / Results  Labs (all labs ordered are listed, but only abnormal results are displayed) Labs Reviewed  CBC WITH DIFFERENTIAL/PLATELET -  Abnormal; Notable for the following:       Result Value   RBC 4.13 (*)    Hemoglobin 11.7 (*)    HCT 36.2 (*)    All other components within normal limits  COMPREHENSIVE METABOLIC PANEL - Abnormal; Notable for the following:    Potassium 3.3 (*)    Calcium 8.6 (*)    Albumin 3.4 (*)    All other components within normal limits  BRAIN NATRIURETIC  PEPTIDE  TROPONIN I    EKG  EKG Interpretation None       Radiology Dg Chest 2 View  Result Date: 07/02/2016 CLINICAL DATA:  Shortness of breath with exertion for 3 weeks. EXAM: CHEST  2 VIEW COMPARISON:  CT chest 07/23/2011 and 12/02/2014. PA and lateral chest 02/28/2016 and 11/28/2014. FINDINGS: Well-circumscribed pleural based mass in the posterior right chest is unchanged since the 2016 examinations. Lungs are otherwise clear. Heart size is normal. No pneumothorax or pleural effusion. IMPRESSION: No acute disease. No change in a pleural based mass and the right chest since 2016. Electronically Signed   By: Inge Rise M.D.   On: 07/02/2016 20:53   Dg Femur Min 2 Views Right  Result Date: 07/02/2016 CLINICAL DATA:  Medial right femur pain radiating into the knee for 3 weeks. No known injury. EXAM: RIGHT FEMUR 2 VIEWS COMPARISON:  None. FINDINGS: There is no evidence of fracture or other focal bone lesions. Soft tissues are unremarkable. IMPRESSION: Negative exam. Electronically Signed   By: Inge Rise M.D.   On: 07/02/2016 20:51    Procedures Procedures (including critical care time)  Medications Ordered in ED Medications  oxyCODONE-acetaminophen (PERCOCET/ROXICET) 5-325 MG per tablet 2 tablet (2 tablets Oral Given 07/02/16 1957)  potassium chloride SA (K-DUR,KLOR-CON) CR tablet 40 mEq (40 mEq Oral Given 07/02/16 2159)  furosemide (LASIX) injection 80 mg (80 mg Intravenous Given 07/02/16 2216)     Initial Impression / Assessment and Plan / ED Course  I have reviewed the triage vital signs and the nursing  notes.  Pertinent labs & imaging results that were available during my care of the patient were reviewed by me and considered in my medical decision making (see chart for details).    Nerve impingement klikely. Will eval for fracture/chf exac.  Workup negative. Will dc on increased lasix/ and K supplementation.  Will also do pred taper for possible nerve issues.  PCP follow up.  Final Clinical Impressions(s) / ED Diagnoses   Final diagnoses:  Right leg pain    New Prescriptions Discharge Medication List as of 07/02/2016 11:08 PM    START taking these medications   Details  oxyCODONE-acetaminophen (PERCOCET) 5-325 MG tablet Take 1-2 tablets by mouth every 8 (eight) hours as needed for severe pain., Starting Tue 07/02/2016, Print    potassium chloride SA (K-DUR,KLOR-CON) 20 MEQ tablet Take 2 tablets (40 mEq total) by mouth 2 (two) times daily., Starting Tue 07/02/2016, Print    predniSONE (DELTASONE) 20 MG tablet 3 tabs po daily x 3 days, then 2 tabs x 3 days, then 1.5 tabs x 3 days, then 1 tab x 3 days, then 0.5 tabs x 3 days, Print         Merrily Pew, MD 07/03/16 1601

## 2016-07-02 NOTE — ED Triage Notes (Signed)
Pt presents to the ed after going to urgent care and being sent here to rule out blood clot in his right leg, presents with complaints of swelling in his right leg and pain in his right calf for 3 weeks.

## 2016-07-02 NOTE — ED Notes (Signed)
Per Vascular Tech, no DVT's noted in visualized veins.

## 2016-07-02 NOTE — Telephone Encounter (Signed)
Reason for walk-in : severe pain in right leg, swelling of both legs and shortness of breath.  Sx's started 3 weeks ago.  "Feels like an elephant is sitting on my leg". Pain starts at hip and to thigh. No relief with Tylenol Arthritis, Ibuprofen, or Tramadol. Pain wakes up during the night. Denies chest pain but has SOB/DOE with walking up the stairs in home." I can normally run up stairs."  Takes Lasix 40 mg and BP medications daily. Missed BP medication dose on Sat. And Sun. Due to traveling for funeral. Has +2 pitting edema.  Skin is dry and WNL to touch on BLE.  Pain with dorsiflexion and flexion in right foot. NAD, pt accompanied with wife. Pt able to speak in full sentence. Gait is normal, but slower due to pain.   VS: BP: 143/81  P: 87  R:  Wt: 382.0 lbs. Wt increase of 28 lbs since last OV, January (354 lbs. )  Pt advised to go to ED for further evaluation. Concerns for CHF or  DVT/PE.

## 2016-07-02 NOTE — ED Notes (Signed)
Vascular Tech in w/pt. Spouse at bedside also.

## 2016-07-03 MED FILL — ?PREDNISONE 20 MG TABLET: 20 | 12 days supply | Qty: 27 | Fill #0

## 2016-07-03 MED FILL — POTASSIUM CL ER 20 MEQ TAB: 20 | 60 days supply | Qty: 120 | Fill #0

## 2016-07-03 MED FILL — FUROSEMIDE 40 MG TABLET: 40 | 15 days supply | Qty: 30 | Fill #0

## 2016-07-04 ENCOUNTER — Ambulatory Visit: Payer: Self-pay | Attending: Family Medicine | Admitting: Family Medicine

## 2016-07-04 ENCOUNTER — Encounter: Payer: Self-pay | Admitting: Family Medicine

## 2016-07-04 ENCOUNTER — Ambulatory Visit (HOSPITAL_COMMUNITY)
Admission: RE | Admit: 2016-07-04 | Discharge: 2016-07-04 | Disposition: A | Discharge status: Home / Self Care | Payer: Self-pay | Source: Ambulatory Visit | Attending: Family Medicine | Admitting: Family Medicine

## 2016-07-04 VITALS — BP 146/77 | HR 92 | Temp 98.1°F | Ht 68.0 in | Wt 379.4 lb

## 2016-07-04 DIAGNOSIS — Z6841 Body Mass Index (BMI) 40.0 and over, adult: Secondary | ICD-10-CM | POA: Insufficient documentation

## 2016-07-04 DIAGNOSIS — I10 Essential (primary) hypertension: Secondary | ICD-10-CM | POA: Insufficient documentation

## 2016-07-04 DIAGNOSIS — Z79899 Other long term (current) drug therapy: Secondary | ICD-10-CM | POA: Insufficient documentation

## 2016-07-04 DIAGNOSIS — Z87891 Personal history of nicotine dependence: Secondary | ICD-10-CM | POA: Insufficient documentation

## 2016-07-04 DIAGNOSIS — M7989 Other specified soft tissue disorders: Secondary | ICD-10-CM | POA: Insufficient documentation

## 2016-07-04 DIAGNOSIS — M25551 Pain in right hip: Secondary | ICD-10-CM | POA: Insufficient documentation

## 2016-07-04 DIAGNOSIS — E669 Obesity, unspecified: Secondary | ICD-10-CM | POA: Insufficient documentation

## 2016-07-04 MED ORDER — FUROSEMIDE 40 MG PO TABS: ORAL_TABLET | ORAL | 2 refills | Status: AC

## 2016-07-04 NOTE — Assessment & Plan Note (Signed)
Acute right pain  Concern for arthritis  Plan: Percocet prn, patient has Rx from pharmacy Prednisone taper f/u x-ray

## 2016-07-04 NOTE — Assessment & Plan Note (Signed)
A: HTN with fluid retention in obese patient P: Low salt diet BMP with GFR Continue lasix 80 mg BID for next 10 days with plan to titrate back down to 40 mg BID

## 2016-07-04 NOTE — Addendum Note (Signed)
Addended by: Dessa Phi on: 07/04/2016 04:22 PM   Modules accepted: Orders, Level of Service

## 2016-07-04 NOTE — Addendum Note (Signed)
Addended by: Paschal Dopp on: 07/04/2016 04:38 PM   Modules accepted: Orders

## 2016-07-04 NOTE — Patient Instructions (Addendum)
Douglas Camacho was seen today for leg swelling.  Diagnoses and all orders for this visit:  Hypertension, unspecified type -     furosemide (LASIX) 40 MG tablet; Take by mouth lasix 80 mg twice daily for 10 more days, then 40 mg twice daily -     BASIC METABOLIC PANEL WITH GFR  Right hip pain -     DG HIP UNILAT WITH PELVIS MIN 4 VIEWS RIGHT; Future  I suspect weight gain and swelling mostly due to high salt content in diet  For next week try to eat a completely low salt diet No deli meat or cheese No crackers or processed foods  Veggies and fruits  Meats that you cook at home with no added salt Breads or tortillas that are low salt   Please complete x-ray of R hip Continue lasix 80 mg twice daily for next 10 days   f/u in one week for R hip pain and leg swelling  Dr. Armen Pickup  Cooking With Less Salt Cooking with less salt is one way to reduce the amount of sodium you get from food. Depending on your condition and overall health, your health care provider or diet and nutrition specialist (dietitian) may recommend that you reduce your sodium intake. Most people should have less than 2,300 milligrams (mg) of sodium each day. If you have high blood pressure (hypertension), you may need to limit your sodium to 1,500 mg each day. Follow the tips below to help reduce your sodium intake. What do I need to know about cooking with less salt? Shopping   Buy sodium-free or low-sodium products. Look for the following words on food labels:  Low-sodium.  Sodium-free.  Reduced-sodium.  No salt added.  Unsalted.  Buy fresh or frozen vegetables. Avoid canned vegetables.  Avoid buying meats or protein foods that have been injected with broth or saline solution.  Avoid cured or smoked meats, such as hot dogs, bacon, salami, ham, and bologna. Reading food labels   Check the food label before buying or using packaged ingredients.  Look for products with no more than 140 mg of sodium in one  serving.  Do not choose foods with salt as one of the first three ingredients on the ingredients list. If salt is one of the first three ingredients, it usually means the item is high in sodium, because ingredients are listed in order of amount in the food item. Cooking   Use herbs, seasonings without salt, and spices as substitutes for salt in foods.  Use sodium-free baking soda when baking.  Grill, braise, or roast foods to add flavor with less salt.  Avoid adding salt to pasta, rice, or hot cereals while cooking.  Drain and rinse canned vegetables before use.  Avoid adding salt when cooking sweets and desserts.  Cook with low-sodium ingredients. What are some salt alternatives? The following are herbs, seasonings, and spices that can be used instead of salt to give taste to your food. Herbs should be fresh or dried. Do not choose packaged mixes. Next to the name of the herb, spice, or seasoning are some examples of foods you can pair it with. Herbs   Bay leaves - Soups, meat and vegetable dishes, and spaghetti sauce.  Basil - [employer], soups, pasta, and fish dishes.  Cilantro - Meat, poultry, and vegetable dishes.  Chili powder - Marinades and Mexican dishes.  Chives - Salad dressings and potato dishes.  Cumin - Mexican dishes, couscous, and meat dishes.  Early Chars -  Fish dishes, sauces, and salads.  Fennel - Meat and vegetable dishes, breads, and cookies.  Garlic (do not use garlic salt) - [place] dishes, meat dishes, salad dressings, and sauces.  Marjoram - Soups, potato dishes, and meat dishes.  Oregano - Pizza and spaghetti sauce.  Parsley - Salads, soups, pasta, and meat dishes.  Rosemary - [place] dishes, salad dressings, soups, and red meats.  Saffron - Fish dishes, pasta, and some poultry dishes.  Sage - Stuffings and sauces.  Tarragon - Fish and [employer].  Thyme - Stuffing, meat, and fish dishes. Seasonings   Lemon juice - Fish dishes,  poultry dishes, vegetables, and salads.  Vinegar - Salad dressings, vegetables, and fish dishes. Spices   Cinnamon - Sweet dishes, such as cakes, cookies, and puddings.  Cloves - Gingerbread, puddings, and marinades for meats.  Curry - Vegetable dishes, fish and poultry dishes, and stir-fry dishes.  Ginger - Vegetables dishes, fish dishes, and stir-fry dishes.  Nutmeg - Pasta, vegetables, poultry, fish dishes, and custard. What are some low-sodium ingredients and foods?  Fresh or frozen fruits and vegetables with no sauce added.  Fresh or frozen whole meats, poultry, and fish with no sauce added.  Eggs.  Noodles, pasta, quinoa, rice.  Shredded or puffed wheat or puffed rice.  Regular or quick oats.  Milk, yogurt, hard cheeses, and low-sodium cheeses. Good cheese choices include Swiss, [employer], and [address]. Always check the label for the serving size and sodium content.  Unsalted butter or margarine.  Unsalted nuts.  Sherbet or ice cream (keep to  cup per serving).  Homemade pudding.  Sodium-free baking soda and baking powder. This is not a complete list of low-sodium ingredients and foods. Contact your dietitian for more options.  Summary  Cooking with less salt is one way to reduce the amount of sodium that you get from food.  Buy sodium-free or low-sodium products.  Check the food label before using or buying packaged ingredients.  Use herbs, seasonings without salt, and spices as substitutes for salt in foods. This information is not intended to replace advice given to you by your health care provider. Make sure you discuss any questions you have with your health care provider. Document Released: 03/11/2005 Document Revised: 03/19/2016 Document Reviewed: 03/19/2016 Elsevier Interactive Patient Education  2017 [employer].

## 2016-07-04 NOTE — Progress Notes (Addendum)
Subjective:  Patient ID: Douglas Camacho, male    DOB: [birth year]  Age: 46 y.o. MRN: [id number]  CC: Leg Swelling   HPI Douglas Camacho has HTN, obesity he presents for   1. ED f/u leg swelling: he was seen in [facility] ED on 07/02/2016 for 1 week bilateral lower leg swelling with pain. Laboratory evaluation was normal with Cr/GFR of 1.02/>60, BNP 15.5, H/H 11.7/36.2, WBC 8.2. X-ray of R femur was negative. CXR was negative for acute disease with no change in a pleural based mass in the right chest since 2016. His BP was notably elevated at 153/82. His weight was 382#.   He was started on increased  Lasix of 80 mg BID (up from baseline dose of 40 mg BID) with potassium supplement. He has R hip pain x 3 weeks. And was percocet and prednisone taper for his pain. He started the taper yesterday. He has not started percocet for pain.   Today he reports leg swelling improved but still persist. He has shortness of breath when walking up stairs. He also has R hip pain that radiates down his groin to his foot. These symptoms started 3 weeks ago. He has pain with twisting and prolonged standing. Pain is 6/10.   He admits to high salt diet.   Social History  Substance Use Topics  . Smoking status: Former Smoker    Packs/day: 0.30    Years: 11.00    Types: Cigarettes    Quit date: 04/24/2010  . Smokeless tobacco: Never Used     Comment: trying to quit  . Alcohol use No     Comment: drinks 4-5 cans of beer daily    Outpatient Medications Prior to Visit  Medication Sig Dispense Refill  . benzonatate (TESSALON) 100 MG capsule Take 1 capsule (100 mg total) by mouth every 8 (eight) hours. 21 capsule 0  . Blood Glucose Monitoring Suppl (TRUE METRIX METER) w/Device KIT 1 each by Does not apply route as needed. 1 kit 0  . ferrous sulfate 325 (65 FE) MG tablet Take 1 tablet (325 mg total) by mouth 2 (two) times daily with a meal. 30 tablet 3  . furosemide (LASIX) 40 MG tablet Take 2 tablets (80 mg total) by  mouth 2 (two) times daily. For three days then resume normal dosing 30 tablet 0  . Glucosamine-Chondroitin 750-600 MG TABS Take 2 tablets by mouth daily.  0  . glucose blood (TRUE METRIX BLOOD GLUCOSE TEST) test strip 1 each by Other route daily. 35 each 11  . hydrALAZINE (APRESOLINE) 100 MG tablet Take 1 tablet (100 mg total) by mouth 3 (three) times daily. 90 tablet 5  . ipratropium (ATROVENT) 0.06 % nasal spray Place 2 sprays into both nostrils 4 (four) times daily. 15 mL 0  . metoprolol succinate (TOPROL-XL) 50 MG 24 hr tablet Take 1 tablet (50 mg total) by mouth daily. Take with or immediately following a meal. 30 tablet 5  . naproxen (NAPROSYN) 500 MG tablet Take 1 tablet (500 mg total) by mouth 2 (two) times daily with a meal. 30 tablet 3  . omeprazole (PRILOSEC) 20 MG capsule Take 1 capsule (20 mg total) by mouth daily. 30 capsule 3  . oseltamivir (TAMIFLU) 75 MG capsule Take 1 capsule (75 mg total) by mouth every 12 (twelve) hours. 10 capsule 0  . oxyCODONE-acetaminophen (PERCOCET) 5-325 MG tablet Take 1-2 tablets by mouth every 8 (eight) hours as needed for severe pain. 10 tablet 0  .  potassium chloride SA (K-DUR,KLOR-CON) 20 MEQ tablet Take 2 tablets (40 mEq total) by mouth 2 (two) times daily. 120 tablet 0  . predniSONE (DELTASONE) 20 MG tablet 3 tabs po daily x 3 days, then 2 tabs x 3 days, then 1.5 tabs x 3 days, then 1 tab x 3 days, then 0.5 tabs x 3 days 27 tablet 0  . spironolactone (ALDACTONE) 25 MG tablet Take 1 tablet (25 mg total) by mouth 2 (two) times daily. 60 tablet 5  . traMADol (ULTRAM) 50 MG tablet Take 1 tablet (50 mg total) by mouth every 8 (eight) hours as needed. 60 tablet 0  . TRUEPLUS LANCETS 28G MISC 1 each by Does not apply route 3 (three) times daily. 100 each 3  . Turmeric 500 MG CAPS Take 500 mg by mouth daily.     No facility-administered medications prior to visit.     ROS Review of Systems  Constitutional: Negative for chills, fatigue, fever and  unexpected weight change.  Eyes: Negative for visual disturbance.  Respiratory: Positive for shortness of breath. Negative for cough.   Cardiovascular: Positive for leg swelling. Negative for chest pain and palpitations.  Gastrointestinal: Negative for abdominal pain, blood in stool, constipation, diarrhea, nausea and vomiting.  Endocrine: Negative for polydipsia, polyphagia and polyuria.  Musculoskeletal: Positive for arthralgias. Negative for back pain, gait problem, myalgias and neck pain.  Skin: Negative for rash.  Allergic/Immunologic: Negative for immunocompromised state.  Hematological: Negative for adenopathy. Does not bruise/bleed easily.  Psychiatric/Behavioral: Negative for dysphoric mood, sleep disturbance and suicidal ideas. The patient is not nervous/anxious.     Objective:  BP (!) 146/77   Pulse 92   Temp 98.1 F (36.7 C) (Oral)   Ht 5' 8"  (1.727 m)   Wt (!) 379 lb 6.4 oz (172.1 kg)   SpO2 97%   BMI 57.69 kg/m   BP/Weight 07/04/2016 0/92/3300 09/27/2261  Systolic BP 335 456 256  Diastolic BP 77 82 76  Wt. (Lbs) 379.4 382 -  BMI 57.69 58.08 -   Wt Readings from Last 3 Encounters:  07/04/16 (!) 379 lb 6.4 oz (172.1 kg)  07/02/16 (!) 382 lb (173.3 kg)  04/16/16 (!) 354 lb 3.2 oz (160.7 kg)    Physical Exam  Constitutional: He appears well-developed and well-nourished. No distress.  Obese   HENT:  Head: Normocephalic and atraumatic.  Neck: Normal range of motion. Neck supple.  Cardiovascular: Normal rate, regular rhythm, normal heart sounds and intact distal pulses.   Pulmonary/Chest: Effort normal and breath sounds normal.  Musculoskeletal: He exhibits edema (trace).       Right hip: He exhibits decreased range of motion and tenderness. He exhibits normal strength, no bony tenderness, no swelling, no crepitus, no deformity and no laceration.       Legs: Neurological: He is alert.  Skin: Skin is warm and dry. No rash noted. No erythema.  Psychiatric: He has a  normal mood and affect.    Assessment & Plan:  Douglas Camacho was seen today for leg swelling.  Diagnoses and all orders for this visit:  Hypertension, unspecified type -     furosemide (LASIX) 40 MG tablet; Take by mouth lasix 80 mg twice daily for 10 more days, then 40 mg twice daily  Right hip pain -     DG HIP UNILAT WITH PELVIS MIN 4 VIEWS RIGHT; Future   There are no diagnoses linked to this encounter.  No orders of the defined types were placed in this  encounter.   Follow-up: Return in about 1 week (around 07/11/2016) for leg swelling and hip pain .   Boykin Nearing MD

## 2016-07-05 LAB — BMP8+EGFR
BUN/Creatinine Ratio: 19 (ref 9–20)
BUN: 19 mg/dL (ref 6–24)
CALCIUM: 9 mg/dL (ref 8.7–10.2)
CO2: 22 mmol/L (ref 18–29)
Chloride: 99 mmol/L (ref 96–106)
Creatinine, Ser: 1 mg/dL (ref 0.76–1.27)
GFR calc Af Amer: 105 mL/min/{1.73_m2} (ref >59)
GFR calc non Af Amer: 90 mL/min/{1.73_m2} (ref >59)
GLUCOSE: 98 mg/dL (ref 65–99)
Potassium: 3.6 mmol/L (ref 3.5–5.2)
Sodium: 142 mmol/L (ref 134–144)

## 2016-07-08 ENCOUNTER — Ambulatory Visit: Payer: Self-pay

## 2016-07-11 MED FILL — SPIRONOLACTONE 25 MG TABLET: 25 | 30 days supply | Qty: 60 | Fill #1

## 2016-07-11 MED FILL — hydrALAZINE HCL 100 MG TABS: 100 | 30 days supply | Qty: 90 | Fill #2

## 2016-07-16 ENCOUNTER — Encounter: Payer: Self-pay | Admitting: Family Medicine

## 2016-07-16 ENCOUNTER — Ambulatory Visit: Payer: Self-pay | Attending: Family Medicine | Admitting: Family Medicine

## 2016-07-16 VITALS — BP 140/79 | HR 93 | Temp 98.1°F | Ht 68.0 in | Wt 382.2 lb

## 2016-07-16 DIAGNOSIS — M7989 Other specified soft tissue disorders: Secondary | ICD-10-CM | POA: Insufficient documentation

## 2016-07-16 DIAGNOSIS — R7309 Other abnormal glucose: Secondary | ICD-10-CM

## 2016-07-16 DIAGNOSIS — M25551 Pain in right hip: Secondary | ICD-10-CM | POA: Insufficient documentation

## 2016-07-16 DIAGNOSIS — Z79899 Other long term (current) drug therapy: Secondary | ICD-10-CM | POA: Insufficient documentation

## 2016-07-16 DIAGNOSIS — Z87891 Personal history of nicotine dependence: Secondary | ICD-10-CM | POA: Insufficient documentation

## 2016-07-16 DIAGNOSIS — Z6841 Body Mass Index (BMI) 40.0 and over, adult: Secondary | ICD-10-CM | POA: Insufficient documentation

## 2016-07-16 DIAGNOSIS — I10 Essential (primary) hypertension: Secondary | ICD-10-CM | POA: Insufficient documentation

## 2016-07-16 LAB — GLUCOSE, POCT (MANUAL RESULT ENTRY): POC Glucose: 130 mg/dL — AB (ref 70–99)

## 2016-07-16 MED ORDER — CYCLOBENZAPRINE HCL 10 MG PO TABS: 10.0000 mg | ORAL_TABLET | Freq: Three times a day (TID) | ORAL | 0 refills | Status: AC | PRN

## 2016-07-16 MED FILL — CYCLOBENZAPRINE 10 MG TAB: 10 | 10 days supply | Qty: 30 | Fill #0

## 2016-07-16 NOTE — Progress Notes (Signed)
Subjective:  Patient ID: Douglas Camacho, male    DOB: April 22, 1970  Age: 46 y.o. MRN: [id number]  CC: Leg Swelling   HPI Dimitrios Balestrieri has HTN,  Morbid obesity,   he presents for   1. HTN and leg swelling: he reports leg swelling has improved. He is still overeating. He reports compliance with low salt diet. No headache or chest pain. He has daytime fatigue.   2. R hip pain: Pain x 5 weeks. Started to improve with prednisone taper. His pain worsened 2 days ago. Worsening pain in low back and R hip. He takes percocet sparingly. He reports increase thirst since starting prednisone. His last A1c was 5.7. His current pain level is 7/10. Pain is lateral hip. R hip x-ray was negative for significant DJD.    Social History  Substance Use Topics  . Smoking status: Former Smoker    Packs/day: 0.30    Years: 11.00    Types: Cigarettes    Quit date: 04/24/2010  . Smokeless tobacco: Never Used     Comment: trying to quit  . Alcohol use No     Comment: drinks 4-5 cans of beer daily    Outpatient Medications Prior to Visit  Medication Sig Dispense Refill  . Blood Glucose Monitoring Suppl (TRUE METRIX METER) w/Device KIT 1 each by Does not apply route as needed. 1 kit 0  . ferrous sulfate 325 (65 FE) MG tablet Take 1 tablet (325 mg total) by mouth 2 (two) times daily with a meal. 30 tablet 3  . furosemide (LASIX) 40 MG tablet Take by mouth lasix 80 mg twice daily for 10 more days, then 40 mg twice daily 60 tablet 2  . Glucosamine-Chondroitin 750-600 MG TABS Take 2 tablets by mouth daily.  0  . glucose blood (TRUE METRIX BLOOD GLUCOSE TEST) test strip 1 each by Other route daily. 35 each 11  . hydrALAZINE (APRESOLINE) 100 MG tablet Take 1 tablet (100 mg total) by mouth 3 (three) times daily. 90 tablet 5  . metoprolol succinate (TOPROL-XL) 50 MG 24 hr tablet Take 1 tablet (50 mg total) by mouth daily. Take with or immediately following a meal. 30 tablet 5  . naproxen (NAPROSYN) 500 MG tablet Take 1  tablet (500 mg total) by mouth 2 (two) times daily with a meal. 30 tablet 3  . omeprazole (PRILOSEC) 20 MG capsule Take 1 capsule (20 mg total) by mouth daily. 30 capsule 3  . oxyCODONE-acetaminophen (PERCOCET) 5-325 MG tablet Take 1-2 tablets by mouth every 8 (eight) hours as needed for severe pain. 10 tablet 0  . potassium chloride SA (K-DUR,KLOR-CON) 20 MEQ tablet Take 2 tablets (40 mEq total) by mouth 2 (two) times daily. 120 tablet 0  . predniSONE (DELTASONE) 20 MG tablet 3 tabs po daily x 3 days, then 2 tabs x 3 days, then 1.5 tabs x 3 days, then 1 tab x 3 days, then 0.5 tabs x 3 days 27 tablet 0  . spironolactone (ALDACTONE) 25 MG tablet Take 1 tablet (25 mg total) by mouth 2 (two) times daily. 60 tablet 5  . traMADol (ULTRAM) 50 MG tablet Take 1 tablet (50 mg total) by mouth every 8 (eight) hours as needed. 60 tablet 0  . TRUEPLUS LANCETS 28G MISC 1 each by Does not apply route 3 (three) times daily. 100 each 3  . Turmeric 500 MG CAPS Take 500 mg by mouth daily.     No facility-administered medications prior to visit.  ROS Review of Systems  Constitutional: Negative for chills, fatigue, fever and unexpected weight change.  Eyes: Negative for visual disturbance.  Respiratory: Positive for shortness of breath. Negative for cough.   Cardiovascular: Positive for leg swelling. Negative for chest pain and palpitations.  Gastrointestinal: Negative for abdominal pain, blood in stool, constipation, diarrhea, nausea and vomiting.  Endocrine: Negative for polydipsia, polyphagia and polyuria.  Musculoskeletal: Positive for arthralgias. Negative for back pain, gait problem, myalgias and neck pain.  Skin: Negative for rash.  Allergic/Immunologic: Negative for immunocompromised state.  Hematological: Negative for adenopathy. Does not bruise/bleed easily.  Psychiatric/Behavioral: Negative for dysphoric mood, sleep disturbance and suicidal ideas. The patient is not nervous/anxious.      Objective:  BP 140/79   Pulse 93   Temp 98.1 F (36.7 C) (Oral)   Ht _0  (1.727 m)   Wt (!) 382 lb 3.2 oz (173.4 kg)   SpO2 96%   BMI 58.11 kg/m   BP/Weight 07/16/2016 07/04/2016 8/93/8101  Systolic BP 751 025 852  Diastolic BP 79 77 82  Wt. (Lbs) 382.2 379.4 382  BMI 58.11 57.69 58.08   Wt Readings from Last 3 Encounters:  07/16/16 (!) 382 lb 3.2 oz (173.4 kg)  07/04/16 (!) 379 lb 6.4 oz (172.1 kg)  07/02/16 (!) 382 lb (173.3 kg)    Physical Exam  Constitutional: He appears well-developed and well-nourished. No distress.  Obese   HENT:  Head: Normocephalic and atraumatic.  Neck: Normal range of motion. Neck supple.  Cardiovascular: Normal rate, regular rhythm, normal heart sounds and intact distal pulses.   Pulmonary/Chest: Effort normal and breath sounds normal.  Musculoskeletal: He exhibits edema (trace).       Right hip: He exhibits decreased range of motion and tenderness. He exhibits normal strength, no bony tenderness, no swelling, no crepitus, no deformity and no laceration.       Legs: Neurological: He is alert.  Skin: Skin is warm and dry. No rash noted. No erythema.  Psychiatric: He has a normal mood and affect.   Lab Results  Component Value Date   HGBA1C 5.7 03/29/2016   CBG 130  Assessment & Plan:  Andri was seen today for leg swelling.  Diagnoses and all orders for this visit:  Right hip pain -     Ambulatory referral to Sports Medicine -     cyclobenzaprine (FLEXERIL) 10 MG tablet; Take 1 tablet (10 mg total) by mouth 3 (three) times daily as needed for muscle spasms.  Elevated hemoglobin A1c -     Glucose (CBG)  Hypertension, unspecified type   There are no diagnoses linked to this encounter.  No orders of the defined types were placed in this encounter.   Follow-up: Return in about 6 weeks (around 08/27/2016) for HTN.   Boykin Nearing MD

## 2016-07-16 NOTE — Patient Instructions (Addendum)
Douglas Camacho was seen today for leg swelling.  Diagnoses and all orders for this visit:  Right hip pain -     Ambulatory referral to Sports Medicine -     cyclobenzaprine (FLEXERIL) 10 MG tablet; Take 1 tablet (10 mg total) by mouth 3 (three) times daily as needed for muscle spasms.  Elevated hemoglobin A1c -     Glucose (CBG)   Diabetes blood sugar goals  Fasting (in AM before breakfast, 8 hrs of no eating or drinking (except water or unsweetened coffee or tea): 90-110 2 hrs after meals: < 160,   No low sugars: nothing < 70    Try taking iron with low sugar orange  juice or vitamin C tablet to improve absorption Weight loss is very important, be mindful about your meals   Keep in mind low salt and low sugar  f/u with me in 6 weeks sooner if needed  Dr. Armen Pickup

## 2016-07-18 NOTE — Assessment & Plan Note (Signed)
Referred pain from low back or bursitis suspected. No significant intraarticular disease. Patient is obese and counseled regarding reduced sodium and reduced sugar diet to promote weight loss.   Flexeril added Sports medicine referral placed

## 2016-07-18 NOTE — Assessment & Plan Note (Signed)
A: HTN in obese patient, edema has improved p: Continue aldactone 25 mg BID  Continue lasix 40 mg BD Hydralazine 100 mg TID Metoprolol XL 50 mg daily  Plan for sleep study Weight loss and low salt diet advised

## 2016-07-23 ENCOUNTER — Encounter (HOSPITAL_COMMUNITY): Payer: Self-pay | Admitting: Emergency Medicine

## 2016-07-23 ENCOUNTER — Emergency Department (HOSPITAL_COMMUNITY)
Admission: EM | Admit: 2016-07-23 | Discharge: 2016-07-23 | Disposition: A | Discharge status: Home / Self Care | Payer: Self-pay | Attending: Emergency Medicine | Admitting: Emergency Medicine

## 2016-07-23 DIAGNOSIS — I10 Essential (primary) hypertension: Secondary | ICD-10-CM | POA: Insufficient documentation

## 2016-07-23 DIAGNOSIS — M545 Low back pain, unspecified: Secondary | ICD-10-CM

## 2016-07-23 DIAGNOSIS — M25551 Pain in right hip: Secondary | ICD-10-CM | POA: Insufficient documentation

## 2016-07-23 DIAGNOSIS — R252 Cramp and spasm: Secondary | ICD-10-CM | POA: Insufficient documentation

## 2016-07-23 DIAGNOSIS — Z79899 Other long term (current) drug therapy: Secondary | ICD-10-CM | POA: Insufficient documentation

## 2016-07-23 DIAGNOSIS — Z87891 Personal history of nicotine dependence: Secondary | ICD-10-CM | POA: Insufficient documentation

## 2016-07-23 LAB — CBC
HCT: 43.2 % (ref 39.0–52.0)
Hemoglobin: 14.1 g/dL (ref 13.0–17.0)
MCH: 29.1 pg (ref 26.0–34.0)
MCHC: 32.6 g/dL (ref 30.0–36.0)
MCV: 89.1 fL (ref 78.0–100.0)
PLATELETS: 204 10*3/uL (ref 150–400)
RBC: 4.85 MIL/uL (ref 4.22–5.81)
RDW: 13.3 % (ref 11.5–15.5)
WBC: 10 10*3/uL (ref 4.0–10.5)

## 2016-07-23 LAB — BASIC METABOLIC PANEL
Anion gap: 9 (ref 5–15)
BUN: 26 mg/dL — AB (ref 6–20)
CHLORIDE: 100 mmol/L — AB (ref 101–111)
CO2: 28 mmol/L (ref 22–32)
CREATININE: 1.21 mg/dL (ref 0.61–1.24)
Calcium: 9.2 mg/dL (ref 8.9–10.3)
GFR calc Af Amer: >60 mL/min (ref >60)
GFR calc non Af Amer: >60 mL/min (ref >60)
Glucose, Bld: 169 mg/dL — ABNORMAL HIGH (ref 65–99)
Potassium: 4.7 mmol/L (ref 3.5–5.1)
SODIUM: 137 mmol/L (ref 135–145)

## 2016-07-23 MED ORDER — CYCLOBENZAPRINE HCL 10 MG PO TABS
10.0000 mg | ORAL_TABLET | Freq: Once | ORAL | Status: AC
  Administered 2016-07-23: 10 mg via ORAL
  Filled 2016-07-23: qty 1

## 2016-07-23 NOTE — ED Provider Notes (Signed)
I didn't check  [facility]-EMERGENCY DEPT Provider Note   CSN: [id number] Arrival date & time: 07/23/16  1044     History   Chief Complaint Chief Complaint  Patient presents with  . Leg Pain    cramping    HPI Douglas Camacho is a 46 y.o. male who presents today with chief complaint intermittent, progressively worsening in frequency cramping of bilateral calves. He states he has experienced this in the past but it seems to be worsening over the last 3-4 days. He states it "feels like a charlie horse x10". He has also been experiencing bilateral hand cramps that last 3-4 minutes in time and then resolve. He was seen for this in the emergency department and evaluated for CHF and was given furosemide and potassium supplementation. States he's been taking his furosemide twice daily, with resolution of his bilateral lower extremity edema. He has been taking potassium supplementation which he believes has been helping. Last night he took potassium and ibuprofen but did not take his blood pressure medications until this morning.   He also endorses acute on chronic right hip pain which hurts intermittently, worse with certain positions. Pain at times radiates down the leg and causes numbness of the right foot. At times pain as stabbing in nature, but is usually dull. No known trauma or falls. Denies tingling or weakness. He at times takes tramadol and Percocet as needed, usually around once per week. He was given a prescription for Flexeril but has not filled it yet. He has an appointment with sports medicine doctor next week for evaluation of his hip and cramps.  Denies CP, SOB, HA, dizziness, LOC, abd pain, n.v.d, dysuria, hematuria  The history is provided by the patient.    Past Medical History:  Diagnosis Date  . Bronchitis   . Chest pain   . GERD (gastroesophageal reflux disease)   . Hypercholesterolemia   . Hypertension   . Obesity   . Pleural mass 2005   bx= benign in [place], [place]  .  Pneumonia Nov. 2012  . Pre-diabetes   . Tobacco abuse     Patient Active Problem List   Diagnosis Date Noted  . Right hip pain 07/04/2016  . Chronic pain of left knee 03/29/2016  . Elevated hemoglobin A1c 03/29/2016  . Mild anemia 03/29/2016  . Plantar fasciitis of left foot 08/09/2011  . 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 right Pleural mass 07/27/2011  . HTN (hypertension) 07/23/2011  . HLD (hyperlipidemia) 07/23/2011  . Obesity 07/23/2011    Past Surgical History:  Procedure Laterality Date  . APPENDECTOMY         Home Medications    Prior to Admission medications   Medication Sig Start Date End Date Taking? Authorizing Provider  Blood Glucose Monitoring Suppl (TRUE METRIX METER) w/Device KIT 1 each by Does not apply route as needed. 03/29/16   Josalyn Funches, MD  cyclobenzaprine (FLEXERIL) 10 MG tablet Take 1 tablet (10 mg total) by mouth 3 (three) times daily as needed for muscle spasms. 07/16/16   Josalyn Funches, MD  ferrous sulfate 325 (65 FE) MG tablet Take 1 tablet (325 mg total) by mouth 2 (two) times daily with a meal. 03/29/16   Boykin Nearing, MD  furosemide (LASIX) 40 MG tablet Take by mouth lasix 80 mg twice daily for 10 more days, then 40 mg twice daily 07/04/16   Boykin Nearing, MD  Glucosamine-Chondroitin 750-600 MG TABS Take 2 tablets by mouth daily. 03/29/16   Boykin Nearing, MD  glucose blood (TRUE METRIX BLOOD GLUCOSE TEST) test strip 1 each by Other route daily. 03/29/16   Josalyn Funches, MD  hydrALAZINE (APRESOLINE) 100 MG tablet Take 1 tablet (100 mg total) by mouth 3 (three) times daily. 03/29/16   Josalyn Funches, MD  metoprolol succinate (TOPROL-XL) 50 MG 24 hr tablet Take 1 tablet (50 mg total) by mouth daily. Take with or immediately following a meal. 03/29/16   Boykin Nearing, MD  naproxen (NAPROSYN) 500 MG tablet Take 1 tablet (500 mg total) by mouth 2 (two) times daily with a meal. 03/13/16   Brayton Caves, PA-C  omeprazole (PRILOSEC) 20 MG capsule Take 1  capsule (20 mg total) by mouth daily. 03/13/16   Brayton Caves, PA-C  oxyCODONE-acetaminophen (PERCOCET) 5-325 MG tablet Take 1-2 tablets by mouth every 8 (eight) hours as needed for severe pain. 07/02/16   Merrily Pew, MD  potassium chloride SA (K-DUR,KLOR-CON) 20 MEQ tablet Take 2 tablets (40 mEq total) by mouth 2 (two) times daily. 07/02/16   Merrily Pew, MD  spironolactone (ALDACTONE) 25 MG tablet Take 1 tablet (25 mg total) by mouth 2 (two) times daily. 03/29/16   Josalyn Funches, MD  traMADol (ULTRAM) 50 MG tablet Take 1 tablet (50 mg total) by mouth every 8 (eight) hours as needed. 04/16/16   Boykin Nearing, MD  TRUEPLUS LANCETS 28G MISC 1 each by Does not apply route 3 (three) times daily. 03/29/16   Josalyn Funches, MD  Turmeric 500 MG CAPS Take 500 mg by mouth daily. 03/29/16   Boykin Nearing, MD    Family History Family History  Problem Relation Age of Onset  . Kidney failure Mother     ESRD on HD for last 23 years  . Diabetes Mother   . Hypertension Mother   . Hypertension Father   . Diabetes Father     Social History Social History  Substance Use Topics  . Smoking status: Former Smoker    Packs/day: 0.30    Years: 11.00    Types: Cigarettes    Quit date: 04/24/2010  . Smokeless tobacco: Never Used     Comment: trying to quit  . Alcohol use No     Comment: drinks 4-5 cans of beer daily     Allergies   Patient has no known allergies.   Review of Systems Review of Systems  Constitutional: Negative for chills and fever.  Respiratory: Negative for shortness of breath.   Cardiovascular: Negative for chest pain.  Gastrointestinal: Negative for abdominal pain, diarrhea, nausea and vomiting.  Genitourinary: Negative for flank pain.  Musculoskeletal: Positive for arthralgias, back pain and myalgias.  Neurological: Positive for numbness. Negative for syncope, weakness and headaches.     Physical Exam Updated Vital Signs BP 127/73   Pulse (!) 103   Temp 98.2 F  (36.8 C) (Oral)   Resp 20   SpO2 100%   Physical Exam  Constitutional: He is oriented to person, place, and time. He appears well-developed and well-nourished.  HENT:  Head: Normocephalic and atraumatic.  Eyes: Conjunctivae are normal. Right eye exhibits no discharge. Left eye exhibits no discharge. No scleral icterus.  Neck: Normal range of motion. Neck supple. No JVD present. No tracheal deviation present.  Cardiovascular: Normal rate, regular rhythm and normal heart sounds.   No murmur heard. 2+ radial and DP/PT pulses bl, negative Homan's bl   Pulmonary/Chest: Effort normal and breath sounds normal. No respiratory distress.  Globally diminished lung sounds  Abdominal: Soft. Bowel sounds  are normal. He exhibits no distension. There is no tenderness.  Genitourinary:  Genitourinary Comments: No CVA tenderness  Musculoskeletal: He exhibits no edema.  No midline spine TTP. TTP of paraspinal musculature in the low back. Full ROM of LSP, "tightness" felt with extension. Limited ROM of BL hips, primarily with flexion. 5/5 strength of BUE and BLE  Neurological: He is alert and oriented to person, place, and time.  Fluent speech, no facial droop, sensation intact globally, normal gait, and patient able to heel walk and toe walk without difficulty.   Skin: Skin is warm and dry. Capillary refill takes less than 2 seconds.  Psychiatric: He has a normal mood and affect. His behavior is normal.  Nursing note and vitals reviewed.    ED Treatments / Results  Labs (all labs ordered are listed, but only abnormal results are displayed) Labs Reviewed  BASIC METABOLIC PANEL - Abnormal; Notable for the following:       Result Value   Chloride 100 (*)    Glucose, Bld 169 (*)    BUN 26 (*)    All other components within normal limits  CBC    EKG  EKG Interpretation None       Radiology No results found.  Procedures Procedures (including critical care time)  Medications Ordered in  ED Medications  cyclobenzaprine (FLEXERIL) tablet 10 mg (10 mg Oral Given 07/23/16 1133)     Initial Impression / Assessment and Plan / ED Course  I have reviewed the triage vital signs and the nursing notes.  Pertinent labs & imaging results that were available during my care of the patient were reviewed by me and considered in my medical decision making (see chart for details).     Patient with known history of cramping and chronic hip pain. Neurovascularly intact, no focal neurological deficits and strength intact. Patient afebrile, vital signs are stable. He will follow up with PCP for hypertension. No suspicion of DVT or peripheral vascular disease, or trauma. Examination consistent with muscle spasm and musculoskeletal pain. Flexeril given in the ED with some improvement. Discussed rice, heat, ice, NSAIDs, gentle stretching, muscle relaxants as needed. Encouraged patient to keep appointment with sports medicine for follow-up. Discussed strict ED return precautions. Pt verbalized understanding of and agreement with plan and is safe for discharge home at this time.   Final Clinical Impressions(s) / ED Diagnoses   Final diagnoses:  Acute bilateral low back pain without sciatica  Right hip pain  Cramping of hands    New Prescriptions Discharge Medication List as of 07/23/2016 12:06 PM       Renita Papa, PA-C 07/23/16 Vivian, MD 07/23/16 [phone]

## 2016-07-23 NOTE — Discharge Instructions (Signed)

## 2016-07-23 NOTE — ED Triage Notes (Addendum)
Pt reports cramping in bilateral legs and occasionally in hands for the past 3-4 days. States he takes potassium pills like he is supposed to. Takes lasix, denies cp or sob, reports he has lessened sodium intake and increased water intake.

## 2016-07-30 ENCOUNTER — Ambulatory Visit: Payer: Self-pay | Admitting: Family Medicine

## 2016-07-30 MED FILL — METOPROLOL SUCC ER 50 MG TA: 50 | 30 days supply | Qty: 30 | Fill #3

## 2016-07-31 ENCOUNTER — Ambulatory Visit (INDEPENDENT_AMBULATORY_CARE_PROVIDER_SITE_OTHER): Payer: Self-pay | Admitting: Student

## 2016-07-31 ENCOUNTER — Encounter: Payer: Self-pay | Admitting: Student

## 2016-07-31 ENCOUNTER — Ambulatory Visit
Admission: RE | Admit: 2016-07-31 | Discharge: 2016-07-31 | Disposition: A | Discharge status: Home / Self Care | Payer: Self-pay | Source: Ambulatory Visit | Attending: Student | Admitting: Student

## 2016-07-31 VITALS — BP 137/81 | Ht 68.0 in | Wt 380.0 lb

## 2016-07-31 DIAGNOSIS — M5441 Lumbago with sciatica, right side: Secondary | ICD-10-CM

## 2016-07-31 DIAGNOSIS — M25551 Pain in right hip: Secondary | ICD-10-CM

## 2016-07-31 DIAGNOSIS — G8929 Other chronic pain: Secondary | ICD-10-CM | POA: Insufficient documentation

## 2016-07-31 DIAGNOSIS — M545 Low back pain: Secondary | ICD-10-CM

## 2016-07-31 MED ORDER — PREDNISONE 10 MG PO TABS: ORAL_TABLET | ORAL | 0 refills | Status: AC

## 2016-07-31 MED FILL — ?PREDNISONE 10 MG TABLET: 10 | 30 days supply | Qty: 48 | Fill #0

## 2016-07-31 NOTE — Progress Notes (Signed)
  Douglas Camacho - 46 y.o. male MRN [id number]  Date of birth: 05/06/1970  SUBJECTIVE:  Including CC & ROS.  CC: low back pain and right hip pain  Presents with low back pain and right hip and leg pain for the past 1 month.  His main pain is her right hip and it radiates down his leg and into the right foot. He reports numbness and tingling in the foot at times. Denies any weakness just a significant amount of pain. He reports that it started a month ago but has worsened over the past 3 weeks. She is unable to sleep at night. He is taking tramadol and Percocet and ibuprofen without relief.   ROS: No unexpected weight loss, fever, chills, swelling, instability, muscle pain, numbness/tingling, redness, otherwise see HPI   PMHx - Updated and reviewed.  Contributory factors include: Hypertension, obesity PSHx - Updated and reviewed.  Contributory factors include:  Negative FHx - Updated and reviewed.  Contributory factors include:  Negative Social Hx - Updated and reviewed. Contributory factors include: Smoker but trying to quit Medications - reviewed   DATA REVIEWED: Left hip x-ray with no degenerative changes  PHYSICAL EXAM:  VS: BP:137/81  HR: bpm  TEMP: ( )  RESP:   HT:5\' 8"  (172.7 cm)   WT:(!) 380 lb (172.4 kg)  BMI:57.9 PHYSICAL EXAM: Gen: NAD, alert, cooperative with exam, well-appearing, obese HEENT: clear conjunctiva,  CV:  no edema, capillary refill brisk, normal rate Resp: non-labored Skin: no rashes, normal turgor  Neuro: no gross deficits.  Psych:  alert and oriented  Back Exam:  Inspection: Unremarkable  Palpable tenderness: diffuse, no point midline TTP. Difficult to assess range of motion of the back and strength of the lower extremities secondary to pain Strength at foot: Plantar-flexion: 5/5 Dorsi-flexion: 5/5 Eversion: 5/5 Inversion: 5/5  Sensory change: Gross sensation intact to all lumbar and sacral dermatomes.  Reflexes: 2+ at both patellar tendons, 2+ at  achilles tendons, Babinski's downgoing.  Gait unremarkable. SLR laying: Unable to assess the CAD had pain with any motion of the right leg FABER: Positive.  Hip: ROM Diffuse decreased range of motion of the right hip secondary to pain Strength Difficult to assess strength due to pain Pelvic alignment unremarkable to inspection and palpation. Standing hip rotation and gait without trendelenburg sign / unsteadiness. Positive pain with FABER or FADIR.   ASSESSMENT & PLAN:   Right hip pain Uncertain if this is coming from his hip truly or if it is coming from his low back. We'll get standing hip x-rays and low back x-rays. Will treat with prednisone Dosepak as this will help both his back and his hips. We will have him follow-up in 2 weeks to reassess his exam to see if can figure out if his coming more from his back or more from his hips at that time on exam. Suspect that it may be coming from his back based on his symptoms.  Chronic bilateral low back pain without sciatica Uncertain coming from his low back or from his hip joint. Will treat with a prednisone Dosepak to see if this helps his symptoms. We'll follow-up in 2 weeks to reassess and do a repeat exam hopefully with less pain.  Suspect that it is likely from his back based on his symptoms.

## 2016-07-31 NOTE — Assessment & Plan Note (Signed)
Uncertain if this is coming from his hip truly or if it is coming from his low back. We'll get standing hip x-rays and low back x-rays. Will treat with prednisone Dosepak as this will help both his back and his hips. We will have him follow-up in 2 weeks to reassess his exam to see if can figure out if his coming more from his back or more from his hips at that time on exam. Suspect that it may be coming from his back based on his symptoms.

## 2016-07-31 NOTE — Assessment & Plan Note (Signed)
Uncertain coming from his low back or from his hip joint. Will treat with a prednisone Dosepak to see if this helps his symptoms. We'll follow-up in 2 weeks to reassess and do a repeat exam hopefully with less pain.  Suspect that it is likely from his back based on his symptoms.

## 2016-08-06 MED FILL — FUROSEMIDE 20 MG TABLET: 20 | 30 days supply | Qty: 120 | Fill #3

## 2016-08-07 ENCOUNTER — Encounter: Payer: Self-pay | Admitting: Family Medicine

## 2016-08-15 ENCOUNTER — Ambulatory Visit (INDEPENDENT_AMBULATORY_CARE_PROVIDER_SITE_OTHER): Payer: Self-pay | Admitting: Student

## 2016-08-15 ENCOUNTER — Encounter: Payer: Self-pay | Admitting: Student

## 2016-08-15 VITALS — BP 168/108 | Ht 68.0 in | Wt 380.0 lb

## 2016-08-15 DIAGNOSIS — M545 Low back pain: Secondary | ICD-10-CM

## 2016-08-15 DIAGNOSIS — G8929 Other chronic pain: Secondary | ICD-10-CM

## 2016-08-15 DIAGNOSIS — M25551 Pain in right hip: Secondary | ICD-10-CM

## 2016-08-15 NOTE — Assessment & Plan Note (Signed)
Believe his hip pain to be coming actually from his back. Hip x-rays are negative. Hip exam is benign today.

## 2016-08-15 NOTE — Progress Notes (Signed)
  Genelle BalRamon Schrieber - 46 y.o. male MRN [id number]  Date of birth: 07/14/1970  SUBJECTIVE:  Including CC & ROS.  CC: right hip pain  Patient presents in follow-up for right hip and low back pain after 2 weeks. He was placed on prednisone at his last visit because it was difficult to examine him secondary to pain. He has had x-rays since that time which were reviewed today. 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   ROS: No unexpected weight loss, fever, chills, swelling, instability, muscle pain, numbness/tingling, redness, otherwise see HPI   PMHx - Updated and reviewed.  Contributory factors include: Negative PSHx - Updated and reviewed.  Contributory factors include:  Negative FHx - Updated and reviewed.  Contributory factors include:  Negative Social Hx - Updated and reviewed. Contributory factors include: Negative Medications - reviewed   DATA REVIEWED: 2 views L-spine: L4/L5 facet arthritis 2 views AP pelvis: no OA in hips, SI joint OA  PHYSICAL EXAM:  VS: BP:(!) 168/108  HR: bpm  TEMP: ( )  RESP:   HT:5\' 8"  (172.7 cm)   WT:(!) 380 lb (172.4 kg)  BMI:57.9 PHYSICAL EXAM: Gen: NAD, alert, cooperative with exam, well-appearing HEENT: clear conjunctiva,  CV:  no edema, capillary refill brisk, normal rate Resp: non-labored Skin: no rashes, normal turgor  Neuro: no gross deficits.  Psych:  alert and oriented  Back Exam:  Inspection: Unremarkable  Palpable tenderness: mild ttp in paraspinals. Range of Motion:  Difficult to examine 2/2 weight- Flexion 35 deg; Extension 25 deg; Side Bending to 35 deg bilaterally; Rotation to 35 deg bilaterally  Leg strength: Quad: 5/5 Hamstring: 5/5 Hip flexor: 5/5 Hip abductors: 5/5  Strength at foot: Plantar-flexion: 5/5 Dorsi-flexion: 5/5 Eversion: 5/5 Inversion: 5/5  Sensory change: Gross sensation intact to all lumbar and sacral dermatomes.  Reflexes: 2+ at both patellar tendons, 2+ at achilles  tendons, Babinski's downgoing.  Gait unremarkable. SLR laying: Negative  XSLR laying: Negative  FABER: negative.  Hip: ROM IR: 45 Deg, ER: 45 Deg, Flexion: 80 Deg, Extension: 70 Deg, Abduction: 45 Deg, Adduction: 45 Deg Strength IR: 5/5, ER: 5/5, Flexion: 5/5, Extension: 5/5, Abduction: 5/5, Adduction: 5/5 Pelvic alignment unremarkable to inspection and palpation. Standing hip rotation and gait without trendelenburg sign / unsteadiness. Greater trochanter without tenderness to palpation. No tenderness over piriformis. No pain with FABER or FADIR. No SI joint tenderness and normal minimal SI movement.   ASSESSMENT & PLAN:   Chronic bilateral low back pain without sciatica Is much improved from previous. No radicular symptoms at this time.   Recommended weight loss or walking at this time.  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  Would recommend home exercise program to help with core strengthening.  He will follow-up if this returns and we'll consider getting an MRI.  Offered physical therapy but he would like to try home exercises first.   Right hip pain Believe his hip pain to be coming actually from his back. Hip x-rays are negative. Hip exam is benign today.  Recommend BP check by PCP

## 2016-08-15 NOTE — Assessment & Plan Note (Addendum)
Is much improved from previous. No radicular symptoms at this time.   Recommended weight loss or walking at this time.  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  Would recommend home exercise program to help with core strengthening.  He will follow-up if this returns and we'll consider getting an MRI.  Offered physical therapy but he would like to try home exercises first.

## 2016-08-26 MED FILL — ?OMEPRAZOLE DR 20 MG CAPSUL: 20 | 30 days supply | Qty: 30 | Fill #2

## 2016-08-26 MED FILL — SPIRONOLACTONE 25 MG TABLET: 25 | 30 days supply | Qty: 60 | Fill #2

## 2016-08-26 MED FILL — METOPROLOL SUCC ER 50 MG TA: 50 | 30 days supply | Qty: 30 | Fill #4

## 2016-08-27 ENCOUNTER — Ambulatory Visit: Payer: Self-pay | Admitting: Family Medicine

## 2016-08-30 ENCOUNTER — Other Ambulatory Visit: Payer: Self-pay | Admitting: Family Medicine

## 2016-08-30 DIAGNOSIS — D649 Anemia, unspecified: Secondary | ICD-10-CM

## 2016-08-30 MED FILL — FERROUS SULFATE 325 MG TAB: 325 (65 FE) | 15 days supply | Qty: 30 | Fill #3

## 2016-08-30 MED FILL — FUROSEMIDE 20 MG TABLET: 20 | 30 days supply | Qty: 120 | Fill #4

## 2016-08-30 MED FILL — hydrALAZINE HCL 100 MG TABS: 100 | 30 days supply | Qty: 90 | Fill #3

## 2016-10-15 ENCOUNTER — Other Ambulatory Visit: Payer: Self-pay | Admitting: Family Medicine

## 2016-10-15 ENCOUNTER — Other Ambulatory Visit: Payer: Self-pay | Admitting: Physician Assistant

## 2016-10-15 DIAGNOSIS — I10 Essential (primary) hypertension: Secondary | ICD-10-CM

## 2016-10-15 MED FILL — ?FUROSEMIDE 20 MG TABLET: 20 | 30 days supply | Qty: 120 | Fill #5

## 2016-10-15 MED FILL — ?OMEPRAZOLE DR 20 MG CAPSUL: 20 | 30 days supply | Qty: 30 | Fill #3

## 2016-10-15 MED FILL — hydrALAZINE HCL 100 MG TABS: 100 | 30 days supply | Qty: 90 | Fill #4

## 2016-10-15 MED FILL — METOPROLOL SUCC ER 50 MG TA: 50 | 30 days supply | Qty: 30 | Fill #5

## 2016-10-15 MED FILL — ?SPIRONOLACTONE 25 MG TABLE: 25 | 30 days supply | Qty: 60 | Fill #3

## 2018-06-22 IMAGING — CR DG KNEE COMPLETE 4+V*L*
4 series · 4 of 4 positions shown · non-contrast
Comparison: None.

CLINICAL DATA: Left knee pain and swelling. hears popping when
walks. Currently limping x1 week. No known injury.

EXAM:
LEFT KNEE - COMPLETE 4+ VIEW

[knee ap]
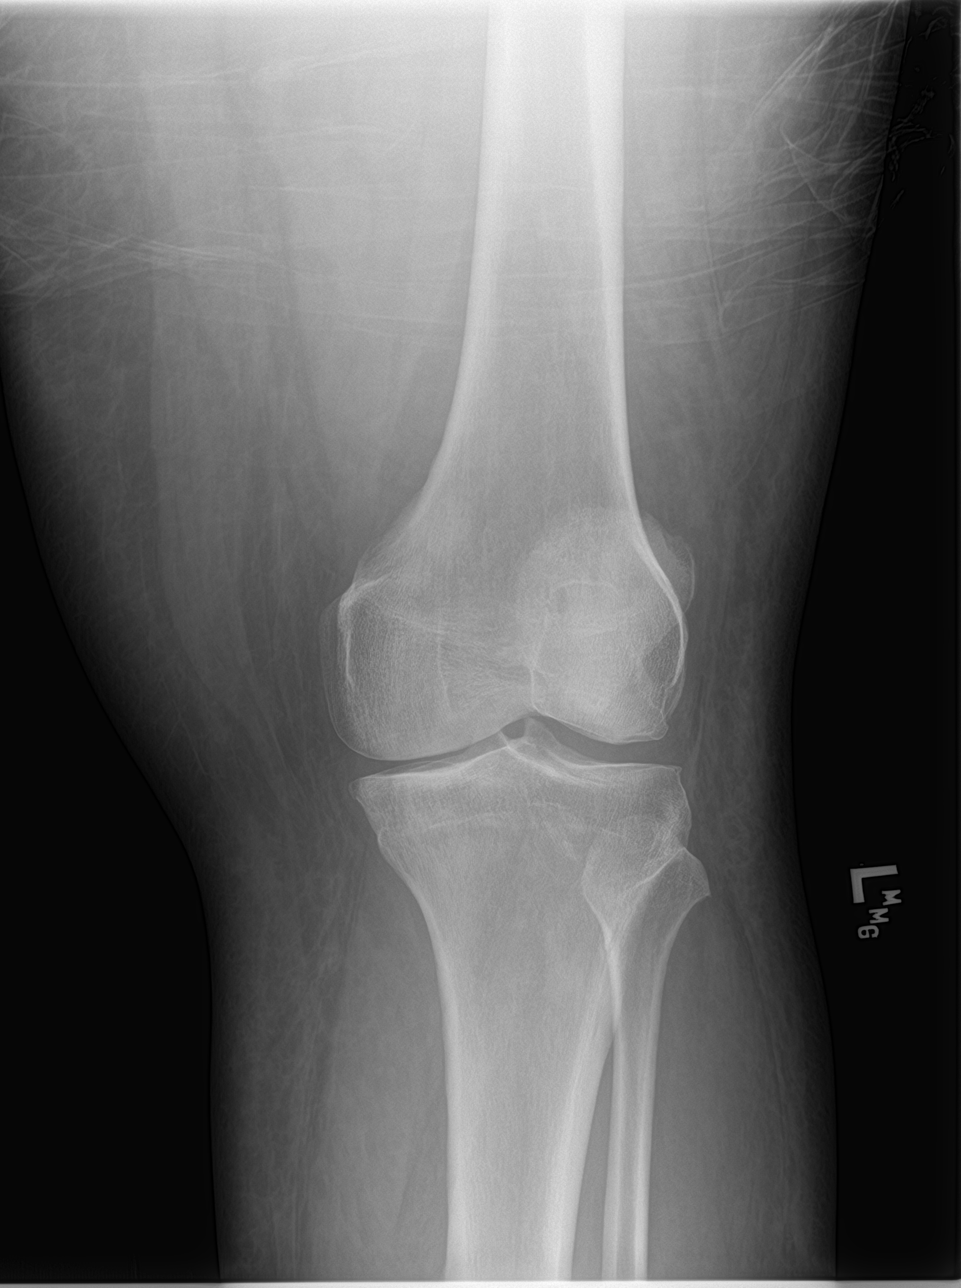

[tunnel]
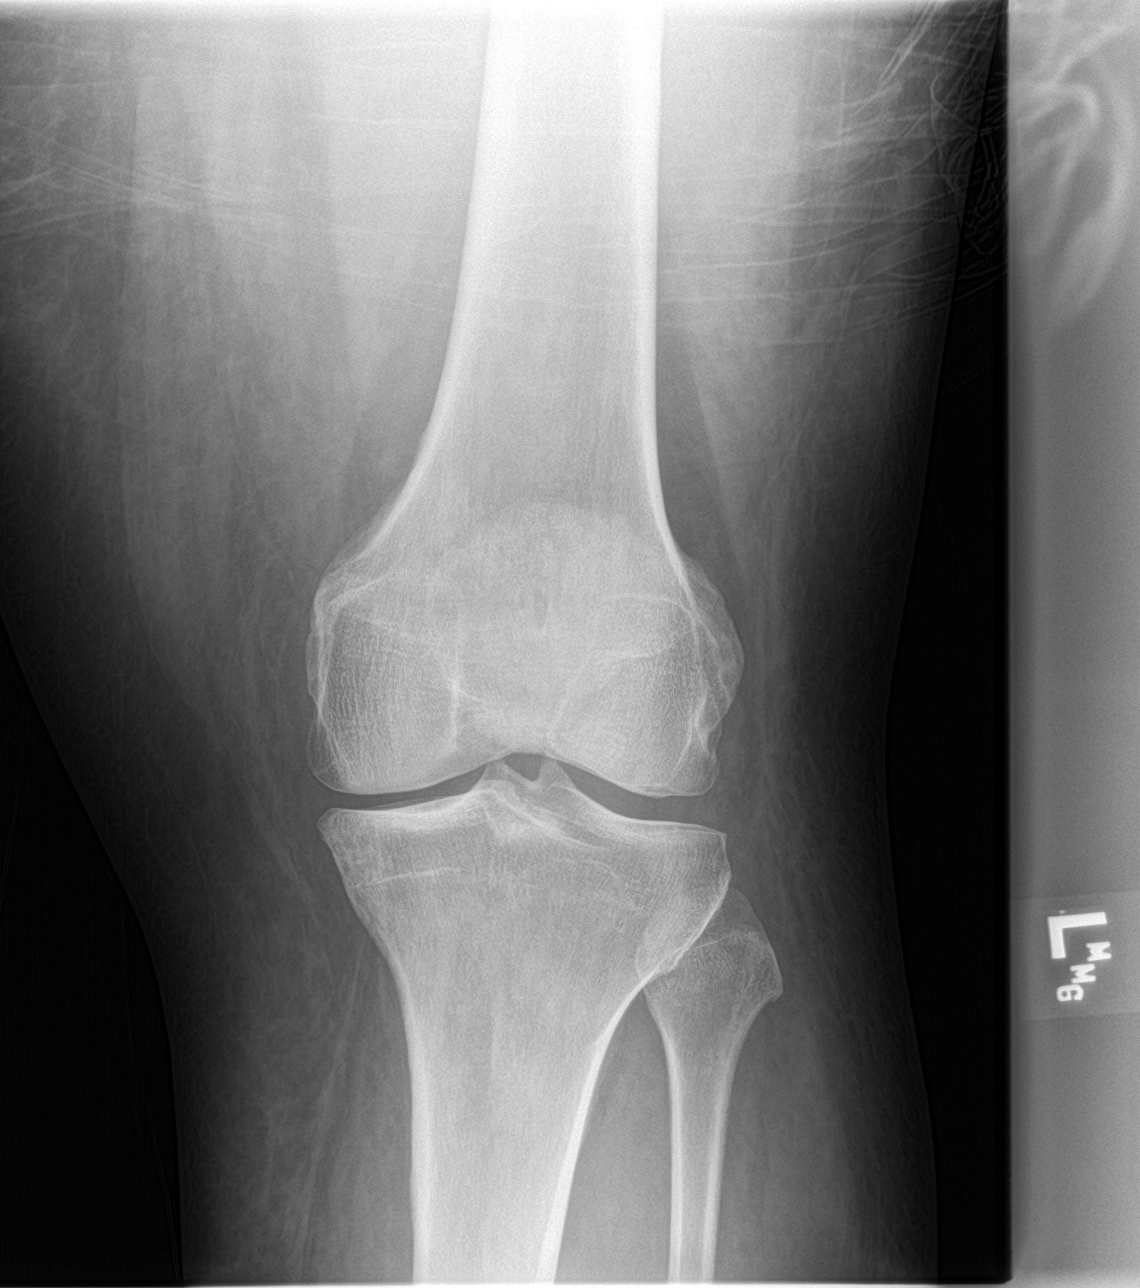

[knee lat]
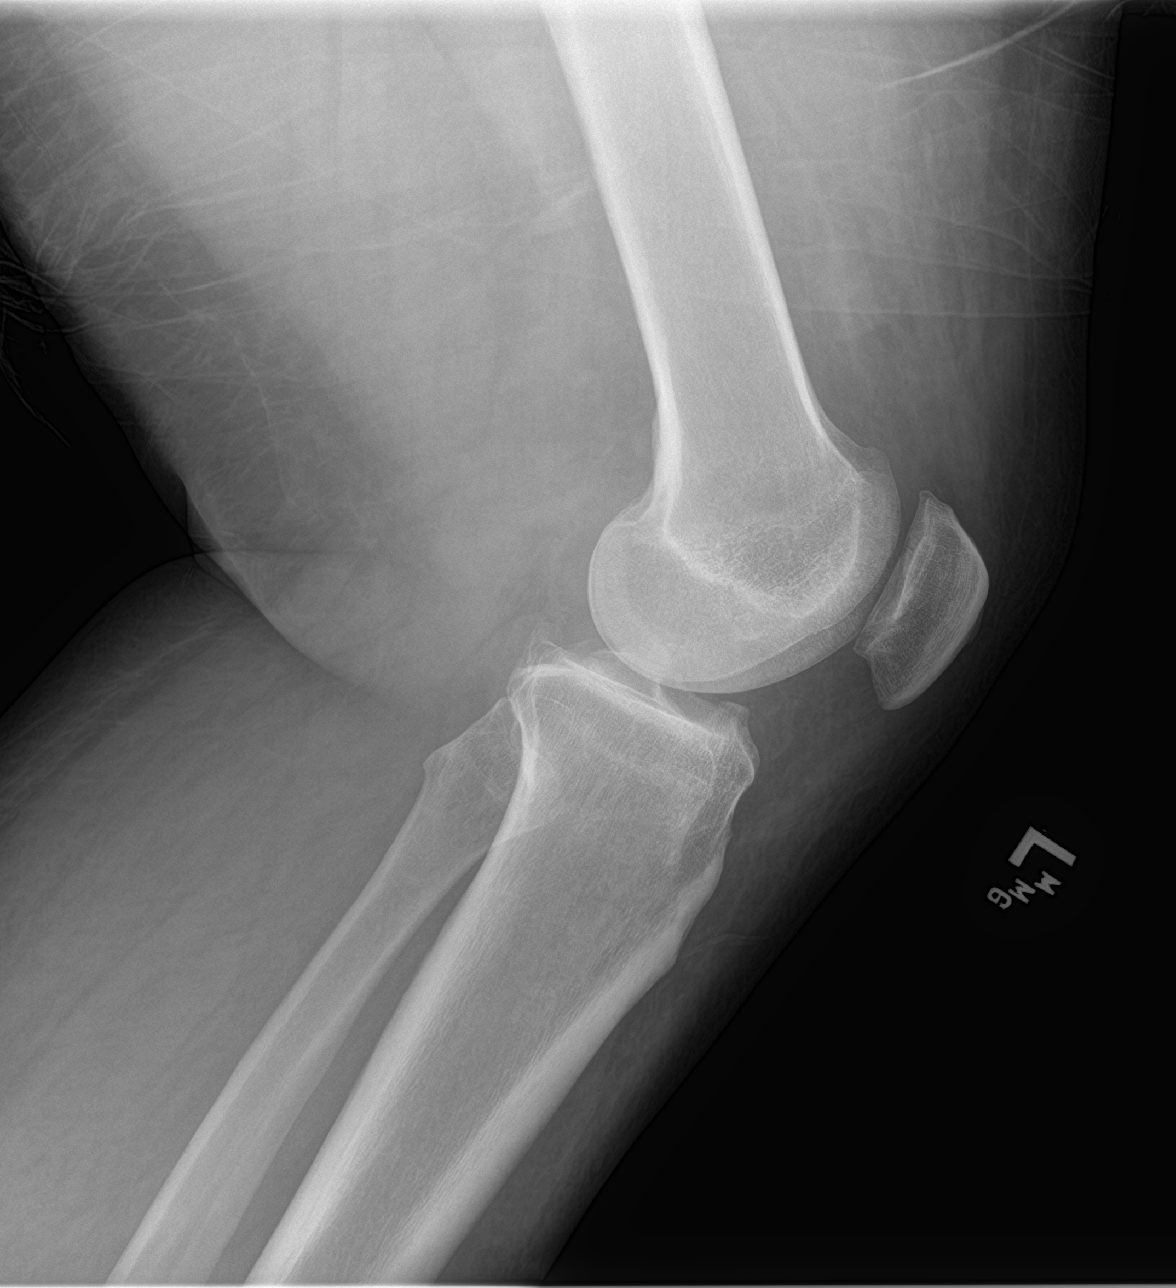

[knee obl]
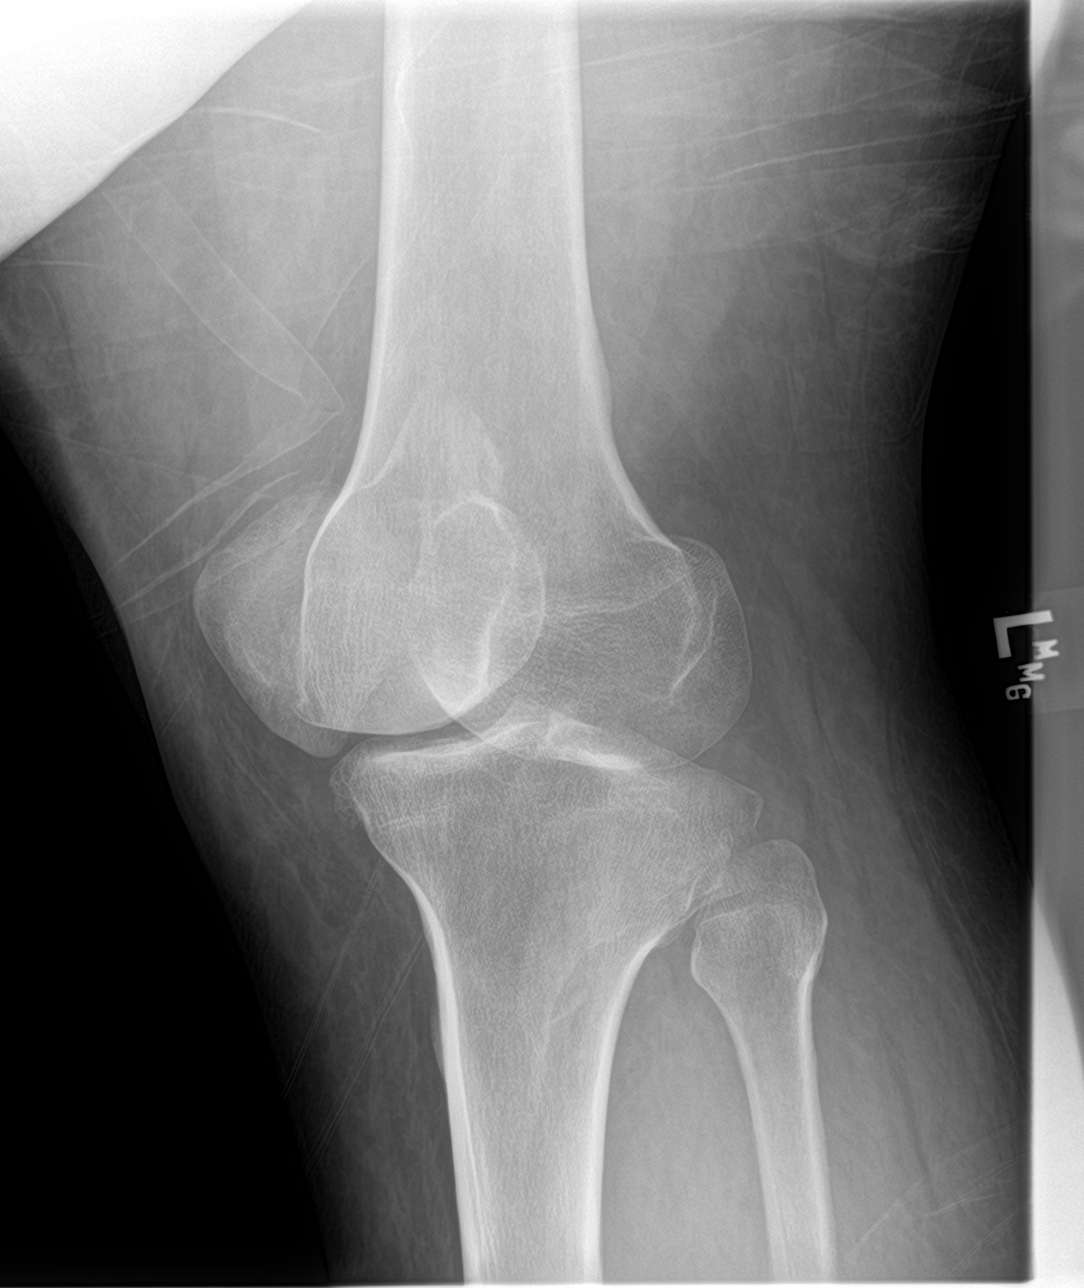

[4 of 4 positions shown; findings below may reference images not displayed]

FINDINGS: There is medial femorotibial joint space narrowing. No acute
fracture, intra-articular loose body nor bone destruction. No joint
effusion. Slight joint space narrowing of the patellofemoral
compartment is suggested.
IMPRESSION: Slight degenerative joint space narrowing of the patellofemoral and
femorotibial compartments of the left knee. No acute osseous
abnormality.

## 2018-10-25 IMAGING — DX DG CHEST 2V
2 series · 2 of 2 positions shown · non-contrast
Comparison: CT chest 07/23/2011 and 12/02/2014. PA and lateral
chest 02/28/2016 and 11/28/2014.

CLINICAL DATA: Shortness of breath with exertion for 3 weeks.

EXAM:
CHEST  2 VIEW

[chest pa]
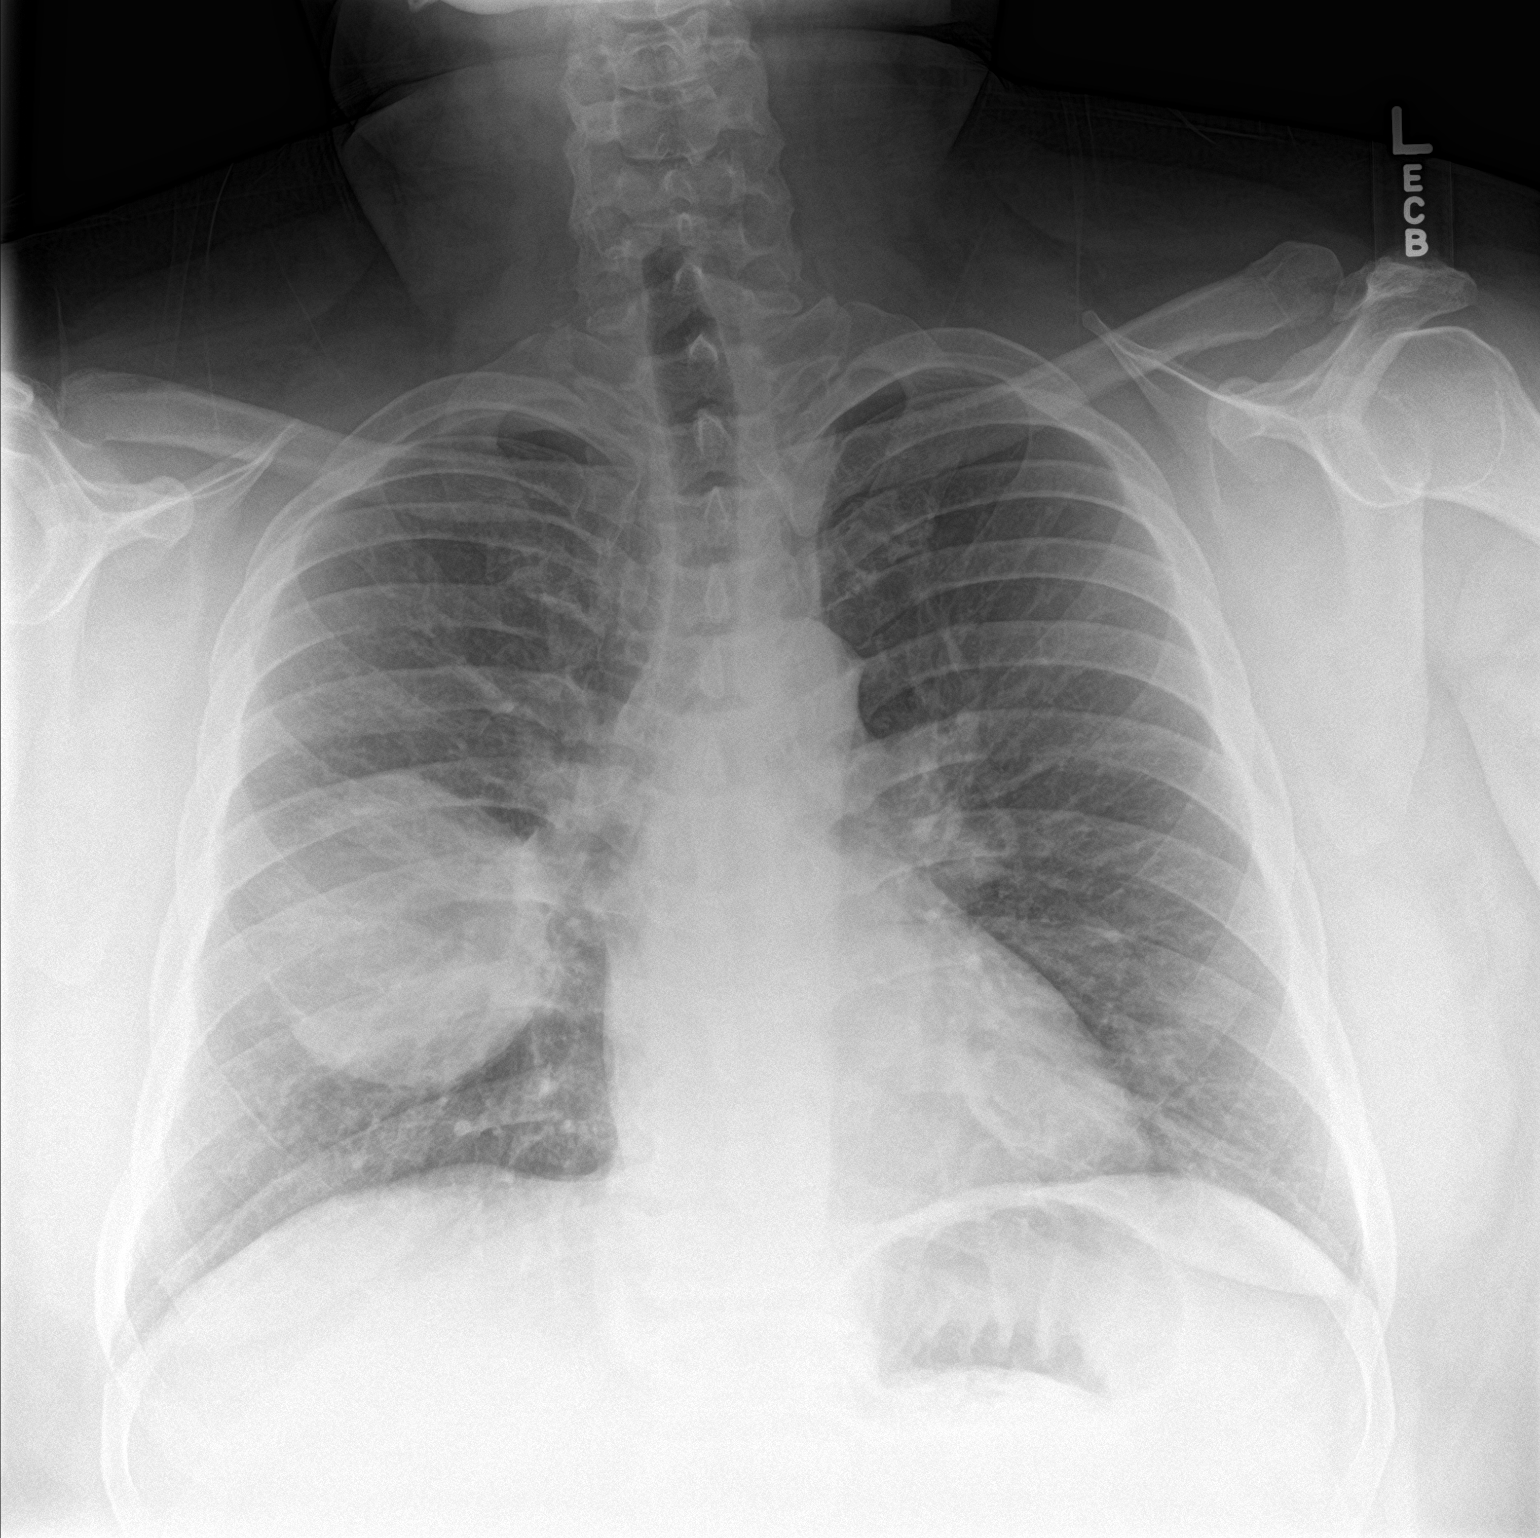

[chest lat]
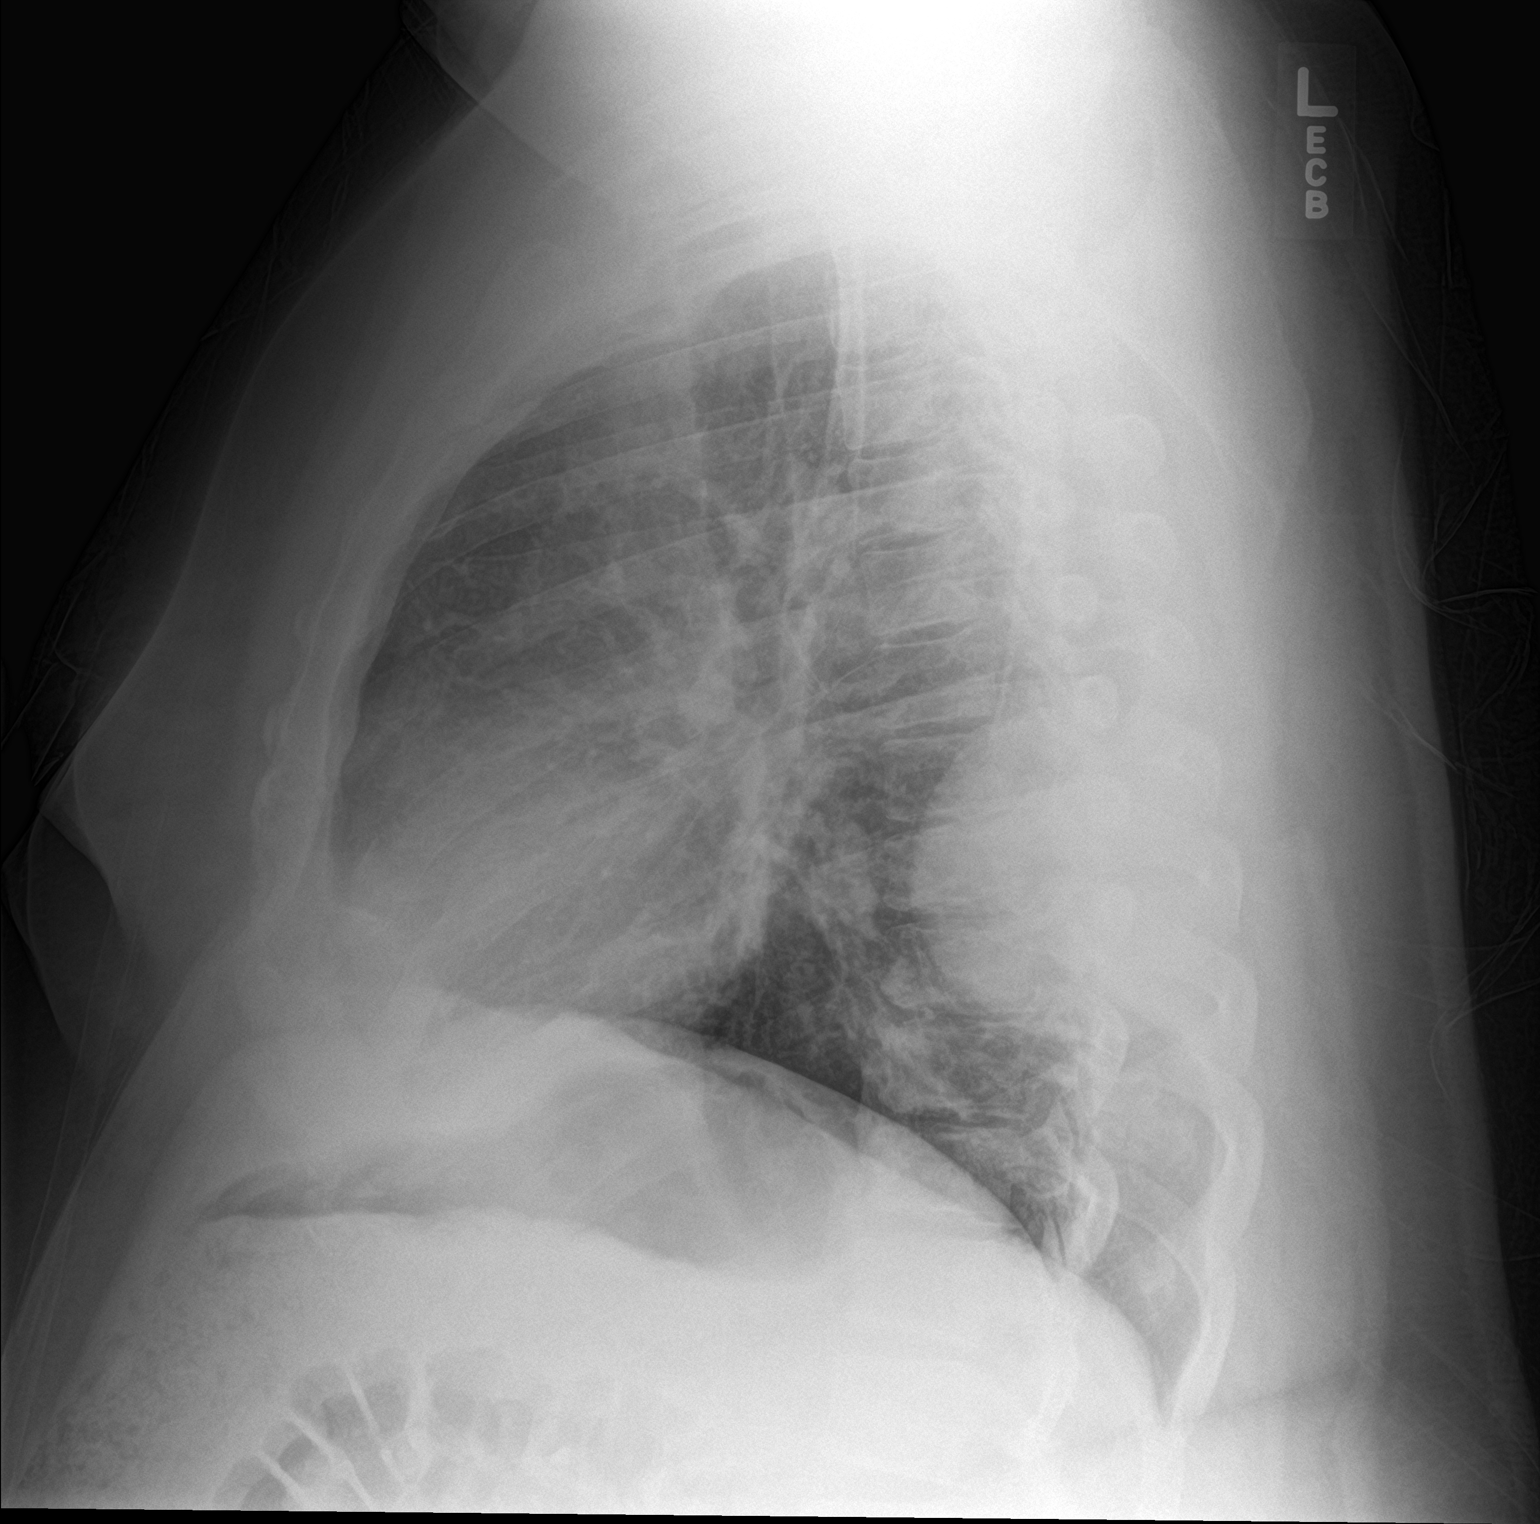

[2 of 2 positions shown; findings below may reference images not displayed]

FINDINGS: Well-circumscribed pleural based mass in the posterior right chest
is unchanged since the 9670 examinations. Lungs are otherwise clear.
Heart size is normal. No pneumothorax or pleural effusion.
IMPRESSION: No acute disease.

No change in a pleural based mass and the right chest since 9670.
